# Patient Record
Sex: Female | Born: 1975 | Race: White | Hispanic: No | Marital: Single | Smoking: Never smoker
Health system: Southern US, Community
[De-identification: ages and names within clinical notes are randomized; demographics above are authoritative.]

## PROBLEM LIST (undated history)

## (undated) DIAGNOSIS — F419 Anxiety disorder, unspecified: Secondary | ICD-10-CM

## (undated) HISTORY — PX: WISDOM TOOTH EXTRACTION: SHX21

---

## 1998-01-19 HISTORY — PX: GANGLION CYST EXCISION: SHX1691

## 1998-08-05 ENCOUNTER — Other Ambulatory Visit: Admission: RE | Admit: 1998-08-05 | Discharge: 1998-08-05 | Payer: Self-pay | Admitting: Gynecology

## 1998-12-05 ENCOUNTER — Other Ambulatory Visit: Admission: RE | Admit: 1998-12-05 | Discharge: 1998-12-05 | Payer: Self-pay | Admitting: Orthopedic Surgery

## 1998-12-07 ENCOUNTER — Emergency Department (HOSPITAL_COMMUNITY): Admission: EM | Admit: 1998-12-07 | Discharge: 1998-12-08 | Payer: Self-pay | Admitting: Emergency Medicine

## 1999-01-03 ENCOUNTER — Encounter: Payer: Self-pay | Admitting: Pulmonary Disease

## 1999-01-03 ENCOUNTER — Ambulatory Visit (HOSPITAL_COMMUNITY): Admission: RE | Admit: 1999-01-03 | Discharge: 1999-01-03 | Payer: Self-pay | Admitting: Pulmonary Disease

## 1999-01-08 ENCOUNTER — Ambulatory Visit (HOSPITAL_COMMUNITY): Admission: RE | Admit: 1999-01-08 | Discharge: 1999-01-08 | Payer: Self-pay | Admitting: Pulmonary Disease

## 1999-04-20 ENCOUNTER — Emergency Department (HOSPITAL_COMMUNITY): Admission: EM | Admit: 1999-04-20 | Discharge: 1999-04-20 | Payer: Self-pay | Admitting: Emergency Medicine

## 1999-07-22 ENCOUNTER — Other Ambulatory Visit: Admission: RE | Admit: 1999-07-22 | Discharge: 1999-07-22 | Payer: Self-pay | Admitting: Gynecology

## 2000-02-12 ENCOUNTER — Encounter: Payer: Self-pay | Admitting: Emergency Medicine

## 2000-02-12 ENCOUNTER — Emergency Department (HOSPITAL_COMMUNITY): Admission: EM | Admit: 2000-02-12 | Discharge: 2000-02-12 | Payer: Self-pay | Admitting: Emergency Medicine

## 2001-01-04 ENCOUNTER — Other Ambulatory Visit: Admission: RE | Admit: 2001-01-04 | Discharge: 2001-01-04 | Payer: Self-pay | Admitting: Gynecology

## 2002-06-05 ENCOUNTER — Emergency Department (HOSPITAL_COMMUNITY): Admission: EM | Admit: 2002-06-05 | Discharge: 2002-06-05 | Payer: Self-pay | Admitting: Emergency Medicine

## 2002-10-06 ENCOUNTER — Encounter: Payer: Self-pay | Admitting: Emergency Medicine

## 2002-10-06 ENCOUNTER — Emergency Department (HOSPITAL_COMMUNITY): Admission: EM | Admit: 2002-10-06 | Discharge: 2002-10-06 | Payer: Self-pay | Admitting: Emergency Medicine

## 2003-05-24 ENCOUNTER — Other Ambulatory Visit: Admission: RE | Admit: 2003-05-24 | Discharge: 2003-05-24 | Payer: Self-pay | Admitting: Gynecology

## 2004-07-02 ENCOUNTER — Other Ambulatory Visit: Admission: RE | Admit: 2004-07-02 | Discharge: 2004-07-02 | Payer: Self-pay | Admitting: Gynecology

## 2005-07-09 ENCOUNTER — Other Ambulatory Visit: Admission: RE | Admit: 2005-07-09 | Discharge: 2005-07-09 | Payer: Self-pay | Admitting: Gynecology

## 2005-07-16 ENCOUNTER — Encounter: Admission: RE | Admit: 2005-07-16 | Discharge: 2005-07-16 | Payer: Self-pay | Admitting: Gynecology

## 2006-07-06 ENCOUNTER — Other Ambulatory Visit: Admission: RE | Admit: 2006-07-06 | Discharge: 2006-07-06 | Payer: Self-pay | Admitting: Gynecology

## 2010-08-29 ENCOUNTER — Other Ambulatory Visit (HOSPITAL_BASED_OUTPATIENT_CLINIC_OR_DEPARTMENT_OTHER): Payer: Self-pay

## 2010-08-29 ENCOUNTER — Emergency Department (HOSPITAL_BASED_OUTPATIENT_CLINIC_OR_DEPARTMENT_OTHER)
Admission: EM | Admit: 2010-08-29 | Discharge: 2010-08-29 | Disposition: A | Discharge status: Home / Self Care | Payer: Self-pay | Attending: Emergency Medicine | Admitting: Emergency Medicine

## 2010-08-29 ENCOUNTER — Other Ambulatory Visit: Payer: Self-pay

## 2010-08-29 ENCOUNTER — Encounter: Payer: Self-pay | Admitting: *Deleted

## 2010-08-29 DIAGNOSIS — R0602 Shortness of breath: Secondary | ICD-10-CM | POA: Insufficient documentation

## 2010-08-29 DIAGNOSIS — F17200 Nicotine dependence, unspecified, uncomplicated: Secondary | ICD-10-CM | POA: Insufficient documentation

## 2010-08-29 DIAGNOSIS — R079 Chest pain, unspecified: Secondary | ICD-10-CM | POA: Insufficient documentation

## 2010-08-29 HISTORY — DX: Anxiety disorder, unspecified: F41.9

## 2010-08-29 LAB — COMPREHENSIVE METABOLIC PANEL
AST: 15 U/L (ref 0–37)
Albumin: 3.8 g/dL (ref 3.5–5.2)
Chloride: 105 mEq/L (ref 96–112)
Creatinine, Ser: 0.5 mg/dL (ref 0.50–1.10)
Potassium: 3.6 mEq/L (ref 3.5–5.1)
Total Bilirubin: 0.8 mg/dL (ref 0.3–1.2)
Total Protein: 6.5 g/dL (ref 6.0–8.3)

## 2010-08-29 LAB — CBC
MCHC: 35.1 g/dL (ref 30.0–36.0)
Platelets: 187 10*3/uL (ref 150–400)
RDW: 12 % (ref 11.5–15.5)
WBC: 8.7 10*3/uL (ref 4.0–10.5)

## 2010-08-29 LAB — CARDIAC PANEL(CRET KIN+CKTOT+MB+TROPI)
CK, MB: 1.4 ng/mL (ref 0.3–4.0)
Relative Index: INVALID (ref 0.0–2.5)

## 2010-08-29 LAB — PREGNANCY, URINE: Preg Test, Ur: NEGATIVE

## 2010-08-29 LAB — D-DIMER, QUANTITATIVE: D-Dimer, Quant: <0.22 ug/mL-FEU (ref 0.00–0.48)

## 2010-08-29 NOTE — ED Notes (Signed)
Pt brought in by EMS , pt c/o CP x 3 days, Given asa 324mg  and nitro sl x 1 by ems PTA without relief. HX anxiety.

## 2010-08-30 NOTE — ED Provider Notes (Signed)
History     CSN: [id number] Arrival date & time: 08/29/2010  7:29 PM  Chief Complaint  Patient presents with  . Chest Pain   Patient is a 35 y.o. female presenting with chest pain. The history is provided by the patient. No language interpreter was used.  Chest Pain The chest pain began 3 - 5 hours ago. Duration of episode(s) is 30 minutes. Chest pain occurs constantly. The chest pain is resolved. The pain is associated with breathing, coughing, stress and exertion. At its most intense, the pain is at 10/10. The pain is currently at 0/10. The quality of the pain is described as aching and tightness. The pain does not radiate. Chest pain is worsened by deep breathing, exertion and stress. Primary symptoms include shortness of breath. Pertinent negatives for primary symptoms include no fever and no cough. She tried nothing for the symptoms. Risk factors include oral contraceptive use, smoking/tobacco exposure and stress.  Her past medical history is significant for anxiety/panic attacks.  Her family medical history is significant for CAD in family.    patient had one episode of chest pain today and said she was uncertain if this is anxiety or not. She was concerned based on how strong the pain was. Patient did describe some variation with respiration with this. There are no other associated modifying factors.  Past Medical History  Diagnosis Date  . Anxiety     History reviewed. No pertinent past surgical history.  History reviewed. No pertinent family history.  History  Substance Use Topics  . Smoking status: Never Smoker   . Smokeless tobacco: Not on file  . Alcohol Use: No    OB History    Grav Para Term Preterm Abortions TAB SAB Ect Mult Living                  Review of Systems  Constitutional: Negative.  Negative for fever.  HENT: Negative.   Eyes: Negative.   Respiratory: Positive for shortness of breath. Negative for cough.   Cardiovascular: Positive for chest pain.    Gastrointestinal: Negative.   Genitourinary: Negative.   Musculoskeletal: Negative.   Skin: Negative.   Neurological: Negative.   Hematological: Negative.   Psychiatric/Behavioral: Negative.   All other systems reviewed and are negative.    Physical Exam  BP 106/61  Pulse 77  Temp(Src) 98.3 F (36.8 C) (Oral)  Resp 16  SpO2 100%  LMP 06/29/2010  Physical Exam  Nursing note and vitals reviewed. Constitutional: She is oriented to person, place, and time. She appears well-developed and well-nourished. No distress.  HENT:  Head: Normocephalic and atraumatic.  Eyes: Conjunctivae and EOM are normal. Pupils are equal, round, and reactive to light.  Neck: Normal range of motion.  Cardiovascular: Normal rate, regular rhythm, normal heart sounds and intact distal pulses.  Exam reveals no gallop and no friction rub.   No murmur heard. Pulmonary/Chest: Effort normal and breath sounds normal. No respiratory distress. She has no wheezes. She has no rales. She exhibits no tenderness.  Abdominal: Soft. Bowel sounds are normal. She exhibits no distension. There is no tenderness. There is no rebound and no guarding.  Musculoskeletal: Normal range of motion. She exhibits no edema and no tenderness.  Neurological: She is alert and oriented to person, place, and time. No cranial nerve deficit. She exhibits normal muscle tone. Coordination normal.  Skin: Skin is warm and dry. No rash noted. No erythema.  Psychiatric: She has a normal mood and affect. Her  behavior is normal.    ED Course  Procedures  Date: 08/30/2010  Rate: 77   Rhythm: normal sinus rhythm  QRS Axis: normal  Intervals: normal  ST/T Wave abnormalities: normal  Conduction Disutrbances:none  Narrative Interpretation:   Old EKG Reviewed: unchanged  MDM Patient was examined by myself and said that her chest pain had resolved at this time. Patient was uncertain if she could be pregnant today and urine pregnancy was ordered and  returned negative. Patient does smoke and is on oral contraceptives. She did have a negative d-dimer. CBC, renal panel, EKG, and troponin were also negative. Patient remained hemodynamically stable and had no pain at the time of discharge. She was discharged home in good condition and told she was welcome to return further emergent concerns.  Assessment: 62 row female who presents today with one episode of chest pain.  Plan: Discharge home in good condition as per MDM above. Patient was shared results of all testing today. She stated understanding assaulters will return if she had any other emergent concerns.      Cyndra Numbers, MD 09/05/10 [phone]

## 2013-01-19 ENCOUNTER — Ambulatory Visit: Payer: 59 | Admitting: Physician Assistant

## 2013-01-19 VITALS — BP 118/72 | HR 91 | Temp 98.0°F | Resp 16 | Ht 59.0 in | Wt 130.2 lb

## 2013-01-19 DIAGNOSIS — J029 Acute pharyngitis, unspecified: Secondary | ICD-10-CM

## 2013-01-19 DIAGNOSIS — R059 Cough, unspecified: Secondary | ICD-10-CM

## 2013-01-19 DIAGNOSIS — R05 Cough: Secondary | ICD-10-CM

## 2013-01-19 MED ORDER — GUAIFENESIN ER 1200 MG PO TB12: 1.0000 | ORAL_TABLET | Freq: Two times a day (BID) | ORAL | Status: DC | PRN

## 2013-01-19 MED ORDER — IPRATROPIUM BROMIDE 0.03 % NA SOLN: 2.0000 | Freq: Two times a day (BID) | NASAL | Status: DC

## 2013-01-19 MED ORDER — HYDROCOD POLST-CHLORPHEN POLST 10-8 MG/5ML PO LQCR: 5.0000 mL | Freq: Two times a day (BID) | ORAL | Status: DC | PRN

## 2013-01-19 NOTE — Progress Notes (Signed)
Subjective:    Patient ID: Heather Bolton, female    DOB: 06/02/1975, 38 y.o.   MRN: [id number]  PCP: No primary provider on file.  Chief Complaint  Patient presents with  . Sore Throat    x 3 weeks   . Cough    x 3 weeks      Active Ambulatory Problems    Diagnosis Date Noted  . No Active Ambulatory Problems   Resolved Ambulatory Problems    Diagnosis Date Noted  . No Resolved Ambulatory Problems   Past Medical History  Diagnosis Date  . Anxiety     Past Surgical History  Procedure Laterality Date  . Ganglion cyst excision Left 2000    No Known Allergies  Prior to Admission medications   Medication Sig Start Date End Date Taking? Authorizing Provider  Multiple Vitamin (MULTIVITAMIN) tablet Take 1 tablet by mouth daily.     Yes Historical Provider, MD  norethindrone-ethinyl estradiol 1/35 (ORTHO-NOVUM, NORTREL,CYCLAFEM) tablet Take 1 tablet by mouth daily.   Yes Historical Provider, MD    History   Social History  . Marital Status: Single    Spouse Name: n/a    Number of Children: 0  . Years of Education: 12th grade   Occupational History  . [employer]    Social History Main Topics  . Smoking status: Never Smoker   . Smokeless tobacco: Never Used  . Alcohol Use: No  . Drug Use: No  . Sexual Activity: No   Other Topics Concern  . None   Social History Narrative   Her mother lives with her.    family history includes Cancer (age of onset: 263) in her maternal grandmother; Cancer (age of onset: 2666) in her paternal grandfather; Diabetes in her father; Hyperlipidemia in her mother; Stroke in her paternal grandfather and paternal grandmother; Stroke (age of onset: 1552) in her mother. indicated that her mother is alive. She indicated that her father is alive. She indicated that her brother is alive. She indicated that her maternal grandmother is deceased. She indicated that her maternal grandfather is deceased. She indicated that her paternal grandmother is  deceased. She indicated that her paternal grandfather is deceased.   HPI  3 weeks ago, developed a sore throat.  She was seen at a [facility] and treated for sinusitis with Augmentin.  She notes that she gets sinus infections 2-3 times annually, but that she got no benefit from the treatment. The sore throat, mostly on the RIGHT, ear congestion and nasal congestion, also worse on the RIGHT.  1 week later, 2 weeks ago, with persistent symptoms, she went to another clinic and was told she didn't need another antibiotic.  She was given a steroid taper (60 mg to 10 mg), Flonase and Norel AD (contains chlorpheniramine, APAP and phenelephrine).  The nasal stuffiness and ear pressure are almost resolved, but the ST and cough persists.  She's "pulled a muscle" in the back with all the coughing.  Tried leftover [employer] Perles and "every home remedy on the internet" without any benefit.  She's had increased indigestion and gas, increased heartburn recently, but no nausea/vomiting or diarrhea.  No fever/chills. No unexplained myalgias/arthralgias or rash.  Review of Systems As above.    Objective:   Physical Exam Blood pressure 118/72, pulse 91, temperature 98 F (36.7 C), temperature source Oral, resp. rate 16, height 4\' 11"  (1.499 m), weight 130 lb 3.2 oz (59.058 kg), last menstrual period 01/02/2013, SpO2 98.00%. Body mass  index is 26.28 kg/(m^2). Well-developed, well nourished WF who is awake, alert and oriented, in NAD. HEENT: [place]/AT, PERRL, EOMI.  Sclera and conjunctiva are clear.  EAC are patent, TMs are normal in appearance. Nasal mucosa is minimally congested, pink and moist. OP is clear. No erythema, exudate or edema. Neck: supple, non-tender, no lymphadenopathy, thyromegaly. Heart: RRR, no murmur Lungs: normal effort, CTA Extremities: no cyanosis, clubbing or edema. Skin: warm and dry without rash. Psychologic: good mood and appropriate affect, normal speech and behavior.           Assessment & Plan:  1. Acute pharyngitis 2. Cough Likely post-nasal drainage from persistent rhinitis. - Guaifenesin (MUCINEX MAXIMUM STRENGTH) 1200 MG TB12; Take 1 tablet (1,200 mg total) by mouth every 12 (twelve) hours as needed.  Dispense: 14 tablet; Refill: 1 - ipratropium (ATROVENT) 0.03 % nasal spray; Place 2 sprays into both nostrils 2 (two) times daily.  Dispense: 30 mL; Refill: 0 - chlorpheniramine-HYDROcodone (TUSSIONEX PENNKINETIC ER) 10-8 MG/5ML LQCR; Take 5 mLs by mouth every 12 (twelve) hours as needed for cough (cough).  Dispense: 100 mL; Refill: 0  Patient Instructions  Get plenty of rest and drink at least 64 ounces of water daily. Continue the Flonase. Either continue the Norel AD or switch to an antihistamine (like Claritin, Zyrtec or Allegra). Take Ibuprofen 600 mg three times daily, with food, or Acetaminophen 325 mg every 4 hours (do not take the acetaminophen AND Norel together).   Supportive care.  Anticipatory guidance.  RTC if symptoms worsen/persist.   Fernande Bras, PA-C Physician Assistant-Certified [facility]

## 2013-01-19 NOTE — Patient Instructions (Addendum)
Get plenty of rest and drink at least 64 ounces of water daily. Continue the Flonase. Either continue the Norel AD or switch to an antihistamine (like Claritin, Zyrtec or Allegra). Take Ibuprofen 600 mg three times daily, with food, or Acetaminophen 325 mg every 4 hours (do not take the acetaminophen AND Norel together).

## 2017-04-20 ENCOUNTER — Other Ambulatory Visit: Payer: Self-pay | Admitting: Obstetrics and Gynecology

## 2017-04-20 ENCOUNTER — Ambulatory Visit
Admission: RE | Admit: 2017-04-20 | Discharge: 2017-04-20 | Disposition: A | Discharge status: Home / Self Care | Payer: BLUE CROSS/BLUE SHIELD | Source: Ambulatory Visit | Attending: Obstetrics and Gynecology | Admitting: Obstetrics and Gynecology

## 2017-04-20 DIAGNOSIS — R928 Other abnormal and inconclusive findings on diagnostic imaging of breast: Secondary | ICD-10-CM

## 2017-04-20 DIAGNOSIS — N63 Unspecified lump in unspecified breast: Secondary | ICD-10-CM

## 2017-10-22 ENCOUNTER — Ambulatory Visit
Admission: RE | Admit: 2017-10-22 | Discharge: 2017-10-22 | Disposition: A | Discharge status: Home / Self Care | Payer: BLUE CROSS/BLUE SHIELD | Source: Ambulatory Visit | Attending: Obstetrics and Gynecology | Admitting: Obstetrics and Gynecology

## 2017-10-22 ENCOUNTER — Other Ambulatory Visit: Payer: Self-pay | Admitting: Obstetrics and Gynecology

## 2017-10-22 DIAGNOSIS — N6001 Solitary cyst of right breast: Secondary | ICD-10-CM

## 2017-10-22 DIAGNOSIS — N63 Unspecified lump in unspecified breast: Secondary | ICD-10-CM

## 2018-04-29 ENCOUNTER — Other Ambulatory Visit: Payer: Self-pay

## 2018-04-29 ENCOUNTER — Ambulatory Visit
Admission: RE | Admit: 2018-04-29 | Discharge: 2018-04-29 | Disposition: A | Discharge status: Home / Self Care | Payer: BLUE CROSS/BLUE SHIELD | Source: Ambulatory Visit | Attending: Obstetrics and Gynecology | Admitting: Obstetrics and Gynecology

## 2018-04-29 ENCOUNTER — Other Ambulatory Visit: Payer: BLUE CROSS/BLUE SHIELD

## 2018-04-29 DIAGNOSIS — N6001 Solitary cyst of right breast: Secondary | ICD-10-CM

## 2019-09-23 IMAGING — MG DIGITAL DIAGNOSTIC BILATERAL MAMMOGRAM WITH TOMO AND CAD
8 series · 8 of 24 positions shown · non-contrast
Comparison: Previous exam(s).

CLINICAL DATA: 43-year-old female presenting for annual bilateral
mammogram and follow-up of probably benign right breast masses.

EXAM:
DIGITAL DIAGNOSTIC BILATERAL MAMMOGRAM WITH CAD AND TOMO
ULTRASOUND RIGHT BREAST

[L CC synth-2D]
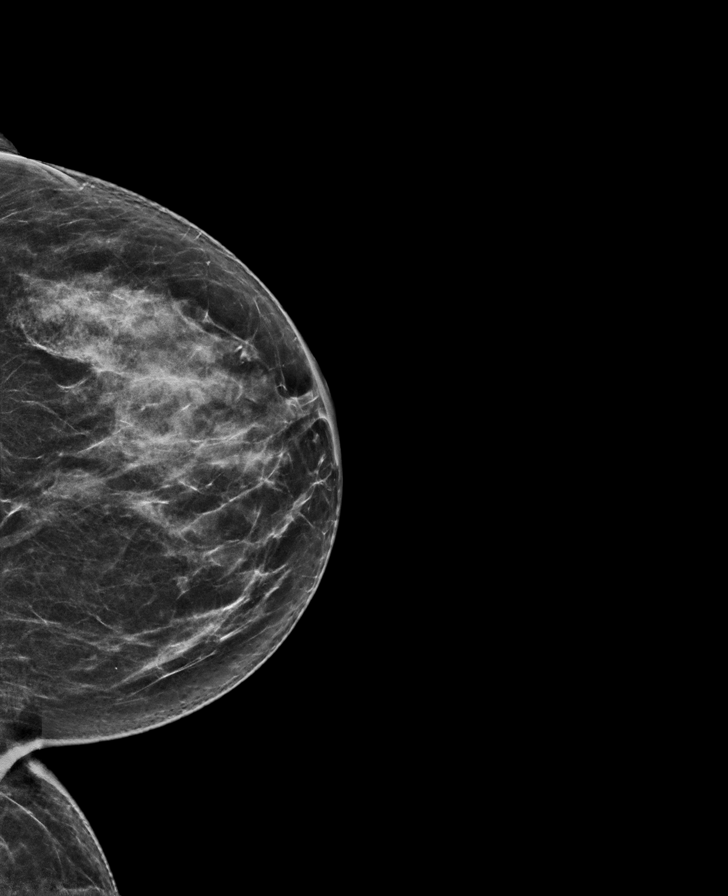

[R MLO synth-2D]
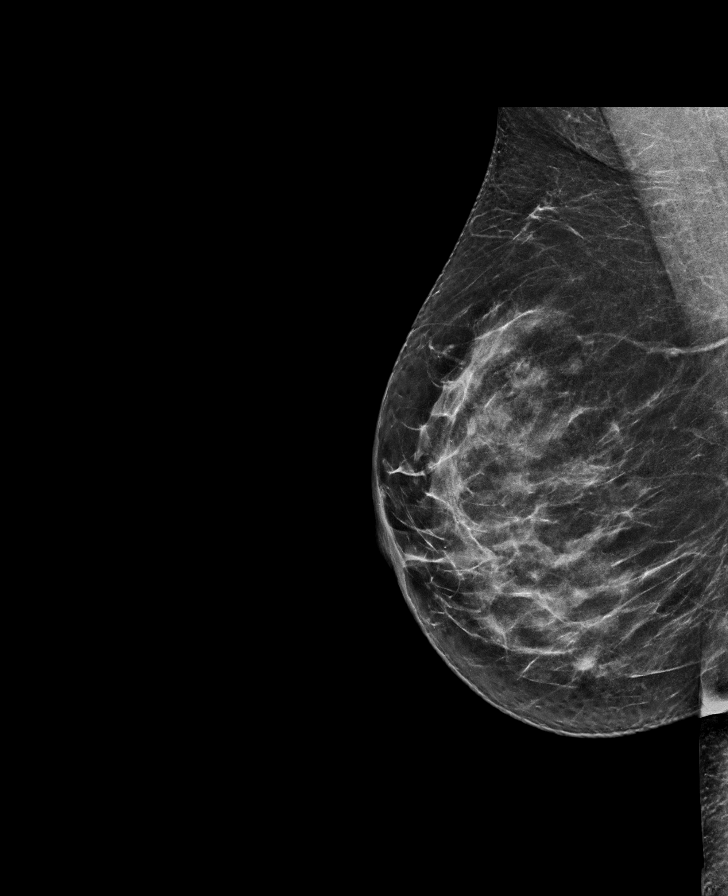

[L MLO synth-2D]
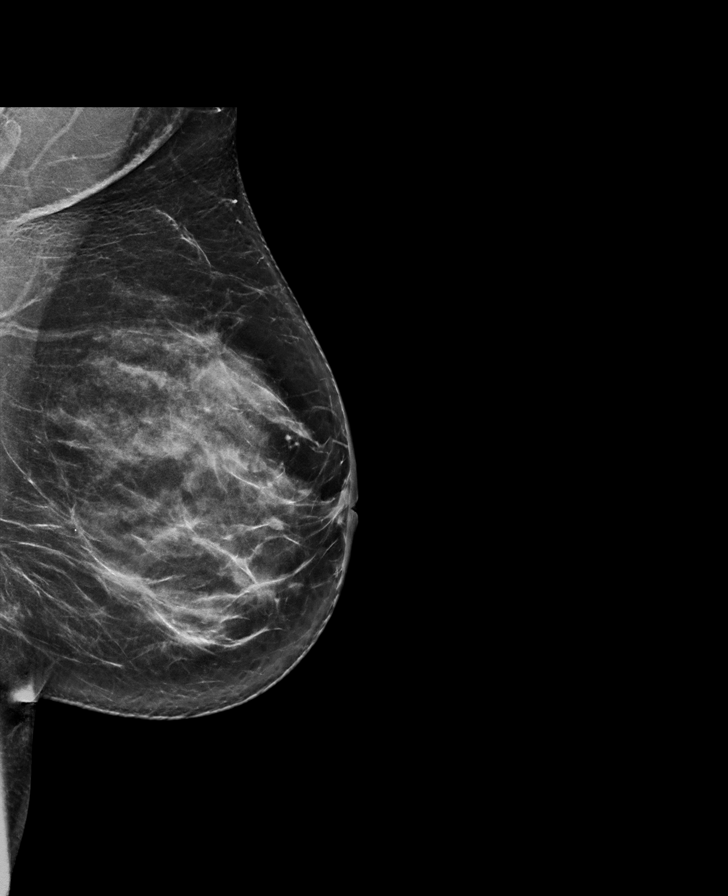

[R CC synth-2D]
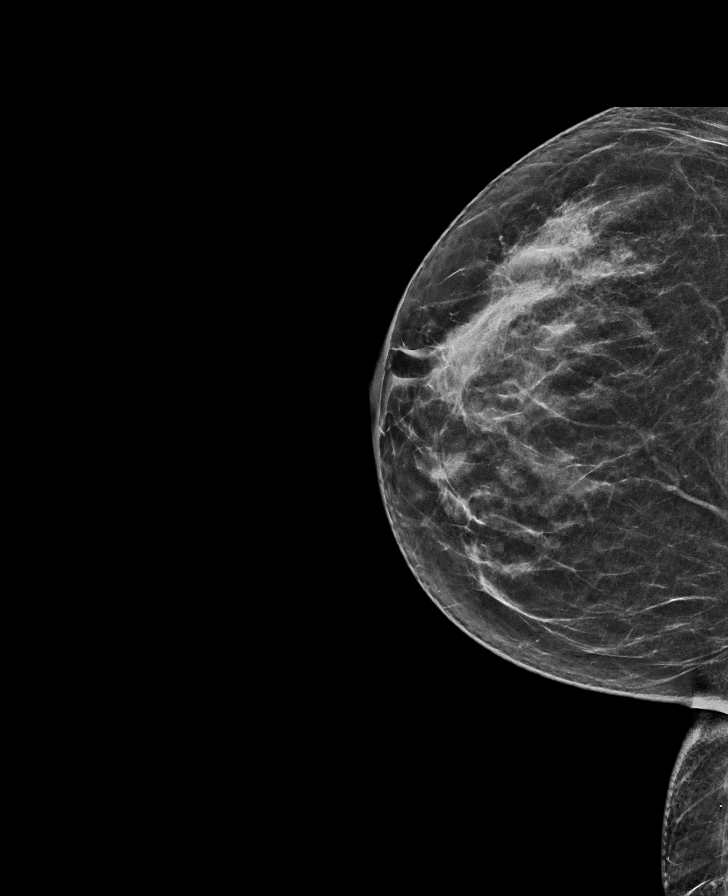

[R MLO tomo · tomo slice 35/70.0]
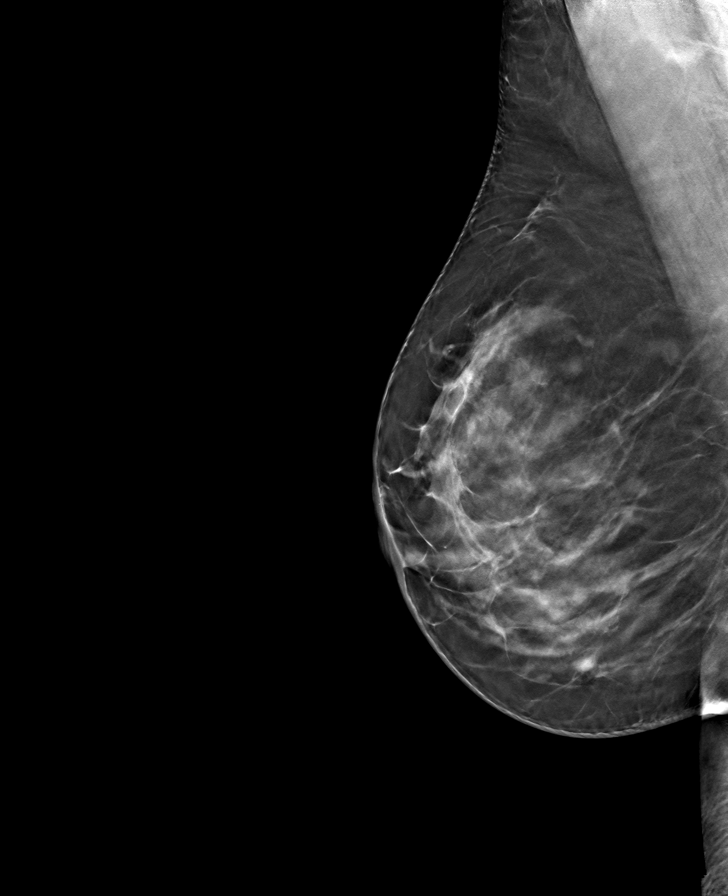

[L MLO tomo · tomo slice 39/77.0]
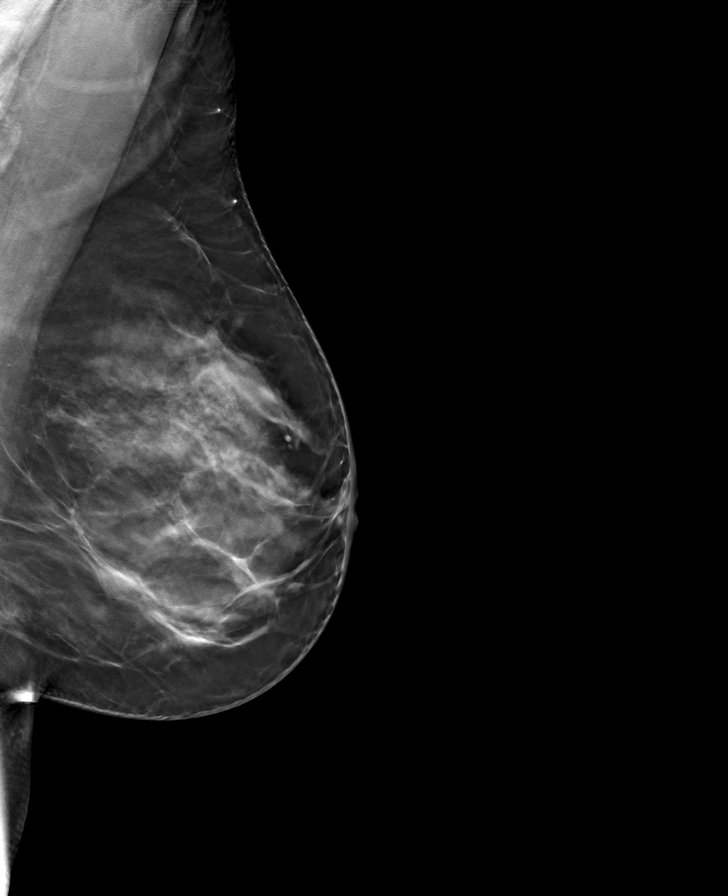

[R CC tomo · tomo slice 33/64.0]
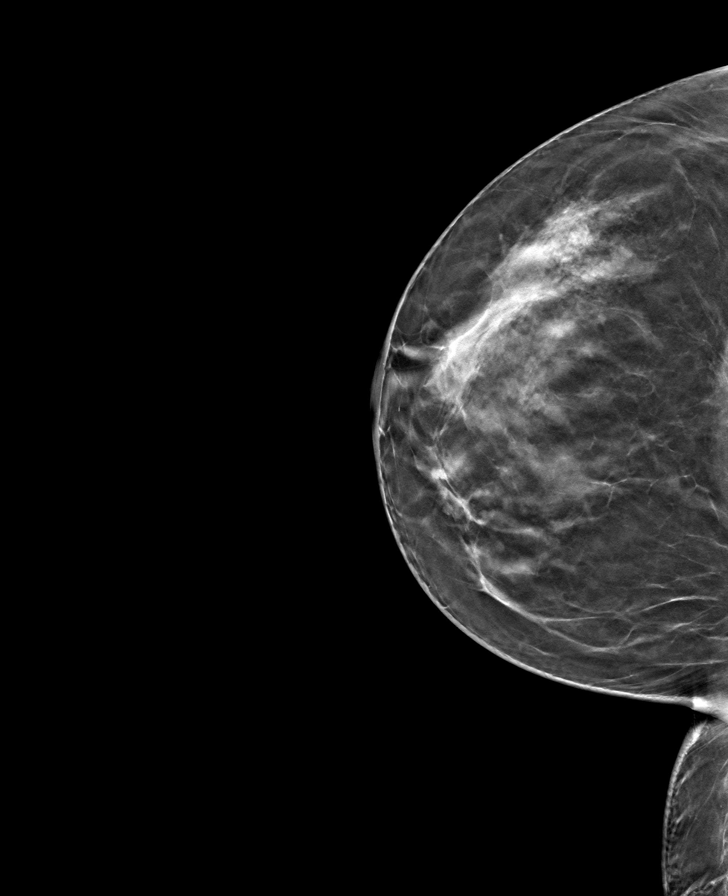

[L CC tomo · tomo slice 35/69.0]
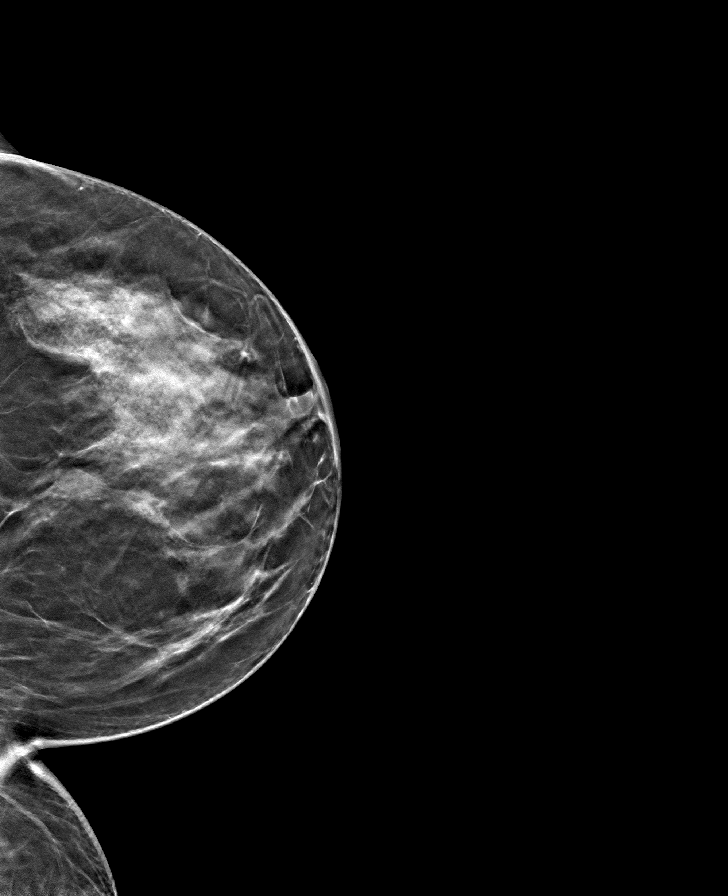

[8 of 24 positions shown; findings below may reference images not displayed]

ACR Breast Density Category c: The breast tissue is heterogeneously
dense, which may obscure small masses.
FINDINGS: No suspicious mammographic findings are identified in either breast.
The parenchymal pattern is stable.

Mammographic images were processed with CAD.

Targeted ultrasound is performed, showing stable appearance of multi
cystic masses within the right breast. This includes a mass at the
10 o'clock position 3 cm from the nipple measuring 9 x 9 x 5 mm
(previously 9 x 9 x 5 mm) and a mass at the 10 o'clock position 4 cm
from the nipple measuring 1.2 x 1.0 x 0.6 cm (previously 1.4 x 1.3 x
0.7 cm).
IMPRESSION: 1. Stable, probably benign right breast masses. Recommend a final
ultrasound follow-up in 1 year to coincide with the patient's annual
bilateral mammogram.
2. No mammographic evidence of malignancy on the left.

RECOMMENDATION:
Bilateral diagnostic mammogram and right breast ultrasound in 1
year.

I have discussed the findings and recommendations with the patient.
Results were also provided in writing at the conclusion of the
visit. If applicable, a reminder letter will be sent to the patient
regarding the next appointment.

BI-RADS CATEGORY  3: Probably benign.

## 2020-07-25 ENCOUNTER — Ambulatory Visit
Admission: RE | Admit: 2020-07-25 | Discharge: 2020-07-25 | Disposition: A | Discharge status: Home / Self Care | Payer: Commercial Managed Care - PPO | Source: Ambulatory Visit | Attending: Emergency Medicine | Admitting: Emergency Medicine

## 2020-07-25 ENCOUNTER — Other Ambulatory Visit: Payer: Self-pay

## 2020-07-25 VITALS — BP 151/80 | HR 88 | Temp 98.4°F

## 2020-07-25 DIAGNOSIS — J069 Acute upper respiratory infection, unspecified: Secondary | ICD-10-CM

## 2020-07-25 MED ORDER — AMOXICILLIN-POT CLAVULANATE 875-125 MG PO TABS: 1.0000 | ORAL_TABLET | Freq: Two times a day (BID) | ORAL | 0 refills | Status: AC

## 2020-07-25 MED ORDER — BENZONATATE 100 MG PO CAPS: 100.0000 mg | ORAL_CAPSULE | Freq: Three times a day (TID) | ORAL | 0 refills | Status: DC

## 2020-07-25 NOTE — ED Provider Notes (Signed)
[facility]   [id number] 07/25/20 Arrival Time: 1148   CC: COVID symptoms  SUBJECTIVE: History from: patient.  Heather Bolton is a 45 y.o. female who presents with sinus congestion, cough, fatigued, vomiting, sore throat, diarrhea x 6 days  Denies sick exposure to COVID, flu or strep.  Denies alleviating or aggravating factors.  Reports hx of covid infection.  Denies fever, chills, SOB, wheezing, chest pain, nausea, changes in bowel or bladder habits.    ROS: As per HPI.  All other pertinent ROS negative.     Past Medical History:  Diagnosis Date   Anxiety    Past Surgical History:  Procedure Laterality Date   GANGLION CYST EXCISION Left 2000   No Known Allergies No current facility-administered medications on file prior to encounter.   Current Outpatient Medications on File Prior to Encounter  Medication Sig Dispense Refill   chlorpheniramine-HYDROcodone (TUSSIONEX PENNKINETIC ER) 10-8 MG/5ML LQCR Take 5 mLs by mouth every 12 (twelve) hours as needed for cough (cough). 100 mL 0   Guaifenesin (MUCINEX MAXIMUM STRENGTH) 1200 MG TB12 Take 1 tablet (1,200 mg total) by mouth every 12 (twelve) hours as needed. 14 tablet 1   ipratropium (ATROVENT) 0.03 % nasal spray Place 2 sprays into both nostrils 2 (two) times daily. 30 mL 0   Multiple Vitamin (MULTIVITAMIN) tablet Take 1 tablet by mouth daily.       norethindrone-ethinyl estradiol 1/35 (ORTHO-NOVUM, NORTREL,CYCLAFEM) tablet Take 1 tablet by mouth daily.     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   Social Determinants of Health   Financial Resource Strain: Not on file  Food Insecurity: Not on file  Transportation Needs: Not on file  Physical Activity: Not on file  Stress: Not on file  Social Connections: Not on file  Intimate Partner Violence: Not on file   Family History  Problem Relation Age of Onset   Hyperlipidemia Mother    Stroke Mother 38   Diabetes Father    Cancer Maternal Grandmother 35       colon   Stroke Paternal Grandmother    Cancer Paternal Grandfather 100       lung; remote tobacco history   Stroke Paternal Grandfather     OBJECTIVE:  Vitals:   07/25/20 1208  BP: (!) 151/80  Pulse: 88  Temp: 98.4 F (36.9 C)  TempSrc: Oral  SpO2: 98%     General appearance: alert; appears fatigued, but nontoxic; speaking in full sentences and tolerating own secretions HEENT: NCAT; Ears: EACs clear, TMs pearly gray; Eyes: PERRL.  EOM grossly intact. Nose: nares patent without rhinorrhea, Throat: oropharynx clear, tonsils non erythematous or enlarged, uvula midline  Neck: supple without LAD Lungs: unlabored respirations, symmetrical air entry; cough: mild; no respiratory distress; CTAB Heart: regular rate and rhythm.   Skin: warm and dry Psychological: alert and cooperative; normal mood and affect  ASSESSMENT & PLAN:  1. Viral URI  with cough     Meds ordered this encounter  Medications   amoxicillin-clavulanate (AUGMENTIN) 875-125 MG tablet    Sig: Take 1 tablet by mouth every 12 (twelve) hours for 10 days.    Dispense:  20 tablet    Refill:  0    Order Specific Question:   Supervising Provider    Answer:   Eustace Moore [[id number]]   benzonatate (TESSALON) 100 MG capsule    Sig: Take 1 capsule (100 mg total) by mouth every 8 (eight) hours.    Dispense:  21 capsule    Refill:  0    Order Specific Question:   Supervising Provider    Answer:   Eustace Moore [[id number]]    COVID testing ordered.   It will take between 5-7 days for test results.  Someone will contact you regarding abnormal results.    In the meantime: You should remain isolated in your home for 5 days from symptom onset AND greater than 72 hours after symptoms resolution (absence of fever without the use of fever-reducing medication and improvement in respiratory symptoms), whichever is longer Get plenty of rest and push fluids Tessalon Perles prescribed for cough Augmentin for sinus infection Use OTC zyrtec for nasal congestion, runny nose, and/or sore throat Use OTC flonase for nasal congestion and runny nose Use medications daily for symptom relief Use OTC medications like ibuprofen or tylenol as needed fever or pain Call or go to the ED if you have any new or worsening symptoms such as fever, worsening cough, shortness of breath, chest tightness, chest pain, turning blue, changes in mental status, etc...   Reviewed expectations re: course of current medical issues. Questions answered. Outlined signs and symptoms indicating need for more acute intervention. Patient verbalized understanding. After Visit Summary given.          Rennis Harding, PA-C 07/25/20 1215

## 2020-07-25 NOTE — ED Triage Notes (Signed)
Cough , congestion and sinus pressure x 6 days

## 2020-07-25 NOTE — Discharge Instructions (Addendum)
COVID testing ordered.  It will take between 5-7 days for test results.  Someone will contact you regarding abnormal results.    In the meantime: You should remain isolated in your home for 5 days from symptom onset AND greater than 72 hours after symptoms resolution (absence of fever without the use of fever-reducing medication and improvement in respiratory symptoms), whichever is longer Get plenty of rest and push fluids Tessalon Perles prescribed for cough Augmentin for sinus infection Use OTC zyrtec for nasal congestion, runny nose, and/or sore throat Use OTC flonase for nasal congestion and runny nose Use medications daily for symptom relief Use OTC medications like ibuprofen or tylenol as needed fever or pain Call or go to the ED if you have any new or worsening symptoms such as fever, worsening cough, shortness of breath, chest tightness, chest pain, turning blue, changes in mental status, etc...   DO NOT TAKE ABOVE MEDICATIONS IF PREGNANT

## 2020-07-26 LAB — COVID-19, FLU A+B NAA
Influenza A, NAA: NOT DETECTED
Influenza B, NAA: NOT DETECTED
SARS-CoV-2, NAA: NOT DETECTED

## 2023-01-05 ENCOUNTER — Other Ambulatory Visit (HOSPITAL_COMMUNITY): Payer: Self-pay | Admitting: Adult Health Nurse Practitioner

## 2023-01-05 DIAGNOSIS — E049 Nontoxic goiter, unspecified: Secondary | ICD-10-CM

## 2023-01-05 DIAGNOSIS — Z1231 Encounter for screening mammogram for malignant neoplasm of breast: Secondary | ICD-10-CM

## 2023-01-06 ENCOUNTER — Other Ambulatory Visit (HOSPITAL_COMMUNITY): Payer: Self-pay | Admitting: Adult Health Nurse Practitioner

## 2023-01-06 DIAGNOSIS — N63 Unspecified lump in unspecified breast: Secondary | ICD-10-CM

## 2023-02-04 ENCOUNTER — Encounter: Payer: Self-pay | Admitting: Gastroenterology

## 2023-03-02 ENCOUNTER — Encounter (HOSPITAL_COMMUNITY): Payer: Self-pay

## 2023-03-02 ENCOUNTER — Ambulatory Visit (HOSPITAL_COMMUNITY)
Admission: RE | Admit: 2023-03-02 | Discharge: 2023-03-02 | Disposition: A | Discharge status: Home / Self Care | Payer: Commercial Managed Care - PPO | Source: Ambulatory Visit | Attending: Adult Health Nurse Practitioner

## 2023-03-02 ENCOUNTER — Ambulatory Visit: Payer: Commercial Managed Care - PPO | Admitting: Gastroenterology

## 2023-03-02 ENCOUNTER — Encounter: Payer: Self-pay | Admitting: Gastroenterology

## 2023-03-02 ENCOUNTER — Ambulatory Visit (HOSPITAL_COMMUNITY)
Admission: RE | Admit: 2023-03-02 | Discharge: 2023-03-02 | Disposition: A | Discharge status: Home / Self Care | Payer: Commercial Managed Care - PPO | Source: Ambulatory Visit | Attending: Adult Health Nurse Practitioner | Admitting: Adult Health Nurse Practitioner

## 2023-03-02 VITALS — BP 130/79 | HR 92 | Temp 98.4°F | Ht 59.0 in | Wt 118.2 lb

## 2023-03-02 DIAGNOSIS — K580 Irritable bowel syndrome with diarrhea: Secondary | ICD-10-CM

## 2023-03-02 DIAGNOSIS — K529 Noninfective gastroenteritis and colitis, unspecified: Secondary | ICD-10-CM | POA: Insufficient documentation

## 2023-03-02 DIAGNOSIS — N63 Unspecified lump in unspecified breast: Secondary | ICD-10-CM

## 2023-03-02 DIAGNOSIS — Z800 Family history of malignant neoplasm of digestive organs: Secondary | ICD-10-CM | POA: Diagnosis not present

## 2023-03-02 DIAGNOSIS — E049 Nontoxic goiter, unspecified: Secondary | ICD-10-CM | POA: Diagnosis present

## 2023-03-02 DIAGNOSIS — Z83719 Family history of colon polyps, unspecified: Secondary | ICD-10-CM | POA: Diagnosis not present

## 2023-03-02 NOTE — Patient Instructions (Signed)
Complete labs at [facility]. We will be in touch with results. After results reviewed, we will move forward with colonoscopy.   Try using anti-diarrheal medication 1/2 to 1 tablet daily as needed to control stools.  I will include diet for irritable bowel syndrome (IBS), this may help you control your symptoms, although at this time it is not clear if you have IBS.

## 2023-03-02 NOTE — Progress Notes (Signed)
 GI Office Note    Referring Provider: Roe Rutherford, NP Primary Care Physician:  Roe Rutherford, NP  Primary Gastroenterologist: Roetta Sessions, MD    Chief Complaint   Chief Complaint  Patient presents with   New Patient (Initial Visit)    Pt here IBS-D     History of Present Illness   Heather Bolton is a 48 y.o. female presenting today at the request of Roe Rutherford, NP for further evaluation of "IBS with diarrhea, FH of colon cancer".   Labs 12/2022: White blood cell count 9800, hemoglobin 14.5, platelets 395,000, TSH 2.840, thyroid peroxidase ab of 50, glucose 65, creatinine 0.73, albumin 4.2, total bilirubin 0.5, alkaline phosphatase 74, AST 15, ALT 15.  Today: patient states she had recent abnormal thyroid labs and completed ultrasound today. She states she has had a change in her bowel habits about a year ago. She typically normal BMs but now having issues with diarrhea. Postprandial fecal urgency, abdominal cramps, gassiness. Some normal stools but mostly soft serve or watery stools. BM 5-6 per day. Sometimes blood in stool, but she believes it to be secondary to hemorrhoids. States she has had hemorrhoid banding by Dr. Romie Levee in 2022, only completed 2 of 3 sessions.   She is a long distance Naval architect.  Partners with her husband who also drives.  She states that she does not eat very well when she is on the road 4 to 5 days at a time.  She is not sure if her change in bowel habits related to something she is eating.  Denies any melena.  Rare heartburn, typically with pizza.  No dysphagia.  No vomiting.  No unintentional weight loss.  Taking over-the-counter 8 antidiarrheal such as loperamide if she has more than 3 stools in a day.  [name] just takes 1 loperamide, occasionally has to take 2.  Typically will go a day or 2 without a bowel movement afterwards.    No prior colonoscopy.  Mother has had a history of colon polyps.  Maternal grandmother had  colon cancer in her early 17s.    Medications   Current Outpatient Medications  Medication Sig Dispense Refill   Multiple Vitamin (MULTIVITAMIN) tablet Take 1 tablet by mouth daily.       norgestrel-ethinyl estradiol (LO/OVRAL) 0.3-30 MG-MCG tablet Take 1 tablet by mouth daily.     No current facility-administered medications for this visit.    Allergies   Allergies as of 03/02/2023   (No Known Allergies)    Past Medical History   Past Medical History:  Diagnosis Date   Anxiety     Past Surgical History   Past Surgical History:  Procedure Laterality Date   GANGLION CYST EXCISION Left 2000    Past Family History   Family History  Problem Relation Age of Onset   Hyperlipidemia Mother    Stroke Mother 48   Colon polyps Mother    Diabetes Father    Cancer Maternal Grandmother 4       colon   Stroke Paternal Grandmother    Cancer Paternal Grandfather 23       lung; remote tobacco history   Stroke Paternal Grandfather     Past Social History   Social History   Socioeconomic History   Marital status: Single    Spouse name: n/a   Number of children: 0   Years of education: 12th grade   Highest education level: Not on file  Occupational History  Occupation: Electronics engineer: [employer]  Tobacco Use   Smoking status: Never   Smokeless tobacco: Never  Substance and Sexual Activity   Alcohol use: No   Drug use: No   Sexual activity: Never  Other Topics Concern   Not on file  Social History Narrative   Her mother lives with her.   Social Drivers of Corporate investment banker Strain: Low Risk  (01/11/2023)   Received from [facility]   Overall Financial Resource Strain (CARDIA)    Difficulty of Paying Living Expenses: Not very hard  Food Insecurity: No Food Insecurity (01/11/2023)   Received from [facility]   Hunger Vital Sign    Worried About Running Out of Food in the Last Year: Never true    Ran Out of Food in the Last Year:  Never true  Transportation Needs: No Transportation Needs (01/11/2023)   Received from [facility] - Transportation    Lack of Transportation (Medical): No    Lack of Transportation (Non-Medical): No  Physical Activity: Unknown (01/11/2023)   Received from [facility]   Exercise Vital Sign    Days of Exercise per Week: Patient declined    Minutes of Exercise per Session: Not on file  Stress: No Stress Concern Present (01/11/2023)   Received from [facility] - Occupational Stress Questionnaire    Feeling of Stress : Only a little  Social Connections: Not on file  Intimate Partner Violence: Not At Risk (01/11/2023)   Received from [facility]   HITS    Over the last 12 months how often did your partner physically hurt you?: Never    Over the last 12 months how often did your partner insult you or talk down to you?: Never    Over the last 12 months how often did your partner threaten you with physical harm?: Never    Over the last 12 months how often did your partner scream or curse at you?: Never    Review of Systems   General: Negative for anorexia, weight loss, fever, chills, fatigue, weakness. Eyes: Negative for vision changes.  ENT: Negative for hoarseness, difficulty swallowing , nasal congestion. CV: Negative for chest pain, angina, palpitations, dyspnea on exertion, peripheral edema.  Respiratory: Negative for dyspnea at rest, dyspnea on exertion, cough, sputum, wheezing.  GI: See history of present illness. GU:  Negative for dysuria, hematuria, urinary incontinence, urinary frequency, nocturnal urination.  MS: Negative for joint pain, low back pain.  Derm: Negative for rash or itching.  Neuro: Negative for weakness, abnormal sensation, seizure, frequent headaches, memory loss,  confusion.  Psych: Negative for anxiety, depression, suicidal ideation, hallucinations.  Endo: Negative for unusual weight change.   Heme: Negative for bruising or bleeding. Allergy: Negative for rash or hives.  Physical Exam   BP 130/79   Pulse 92   Temp 98.4 F (36.9 C)   Ht 4\' 11"  (1.499 m)   Wt 118 lb 3.2 oz (53.6 kg)   LMP 02/16/2023   BMI 23.87 kg/m    General: Well-nourished, well-developed in no acute distress.  Head: Normocephalic, atraumatic.   Eyes: Conjunctiva pink, no icterus. Mouth: Oropharyngeal mucosa moist and pink  Neck: Supple without thyromegaly, masses, or lymphadenopathy.  Lungs: Clear to auscultation bilaterally.  Heart: Regular rate and rhythm, no murmurs rubs or gallops.  Abdomen: Bowel sounds are normal, nontender, nondistended, no hepatosplenomegaly or masses,  no abdominal bruits or  hernia, no rebound or guarding.   Rectal: not performed Extremities: No lower extremity edema. No clubbing or deformities.  Neuro: Alert and oriented x 4 , grossly normal neurologically.  Skin: Warm and dry, no rash or jaundice.   Psych: Alert and cooperative, normal mood and affect.  Labs   See hpi  Imaging Studies   MM 3D DIAGNOSTIC MAMMOGRAM BILATERAL BREAST Result Date: 03/02/2023 CLINICAL DATA:  Delayed follow-up for probably benign right breast masses initially identified on screening mammography from 04/19/2017. EXAM: DIGITAL DIAGNOSTIC BILATERAL MAMMOGRAM WITH TOMOSYNTHESIS AND CAD TECHNIQUE: Bilateral digital diagnostic mammography and breast tomosynthesis was performed. The images were evaluated with computer-aided detection. COMPARISON:  Previous exam(s). ACR Breast Density Category c: The breasts are heterogeneously dense, which may obscure small masses. FINDINGS: No suspicious masses or calcifications are seen in either breast. Stable mammographic appearance of the bilateral breast dating back to 2019. There is no mammographic evidence of malignancy in either breast. IMPRESSION: No mammographic evidence of malignancy in either breast. RECOMMENDATION: Screening mammogram in one  year.(Code:[id number]-B-01Y) I have discussed the findings and recommendations with the patient. If applicable, a reminder letter will be sent to the patient regarding the next appointment. BI-RADS CATEGORY  1: Negative. Electronically Signed   By: Edwin Cap M.D.   On: 03/02/2023 11:36    Assessment/Plan:   Change in bowels/diarrhea for one year: -FH colon polyps, colon cancer -less likely infectious etiology given chronicity -screen for celiac, IBD -ultimately she will need colonoscopy due to change in bowel habits, FH of colon polyps (mother), first ever screening, await lab findings first to rule out celiac disease -continue loperamide 1/2 to 1 tablet daily as needed  -handout for IBS provided, not clearly IBS given recent onset of symptoms in her late 64s but may help with symptom control    Leanna Battles. Melvyn Neth, MHS, PA-C [facility]

## 2023-03-04 LAB — TISSUE TRANSGLUTAMINASE, IGA

## 2023-03-04 LAB — SEDIMENTATION RATE: Sed Rate: 38 mm/h — ABNORMAL HIGH (ref 0–32)

## 2023-03-04 LAB — C-REACTIVE PROTEIN: CRP: 16 mg/L — ABNORMAL HIGH (ref 0–10)

## 2023-03-04 LAB — IGA: IgA/Immunoglobulin A, Serum: <5 mg/dL — ABNORMAL LOW (ref 87–352)

## 2023-03-12 ENCOUNTER — Other Ambulatory Visit: Payer: Self-pay

## 2023-03-12 DIAGNOSIS — K529 Noninfective gastroenteritis and colitis, unspecified: Secondary | ICD-10-CM

## 2023-03-12 NOTE — Addendum Note (Signed)
 Addended by: Tiffany Kocher on: 03/12/2023 10:56 AM   Modules accepted: Orders

## 2023-03-12 NOTE — Progress Notes (Signed)
 Ok, thank you

## 2023-03-12 NOTE — Progress Notes (Signed)
 Heather Bolton, I changed the order to what I needed.

## 2023-03-14 LAB — TISSUE TRANSGLUTAMINASE, IGG: Tissue Transglut Ab: 7 U/mL — ABNORMAL HIGH (ref 0–5)

## 2023-03-19 LAB — GLIA (IGA/G) + TTG IGA
Antigliadin Abs, IgA: 3 U (ref 0–19)
Gliadin IgG: 2 U (ref 0–19)
Transglutaminase IgA: <2 U/mL (ref 0–3)

## 2023-03-26 ENCOUNTER — Other Ambulatory Visit: Payer: Self-pay | Admitting: Gastroenterology

## 2023-03-29 ENCOUNTER — Other Ambulatory Visit: Payer: Self-pay | Admitting: *Deleted

## 2023-03-29 ENCOUNTER — Encounter: Payer: Self-pay | Admitting: *Deleted

## 2023-03-29 DIAGNOSIS — K529 Noninfective gastroenteritis and colitis, unspecified: Secondary | ICD-10-CM

## 2023-03-29 DIAGNOSIS — Z800 Family history of malignant neoplasm of digestive organs: Secondary | ICD-10-CM

## 2023-03-29 DIAGNOSIS — Z83719 Family history of colon polyps, unspecified: Secondary | ICD-10-CM

## 2023-03-29 MED ORDER — PEG 3350-KCL-NA BICARB-NACL 420 G PO SOLR: 4000.0000 mL | Freq: Once | ORAL | 0 refills | Status: AC

## 2023-03-29 NOTE — Addendum Note (Signed)
 Addended by: Zada Finders on: 03/29/2023 02:47 PM   Modules accepted: Orders

## 2023-03-30 ENCOUNTER — Telehealth: Payer: Self-pay | Admitting: *Deleted

## 2023-03-30 NOTE — Telephone Encounter (Signed)
 Call pt's insurance and no prior authorization is needed for either code. [place] # [id number]

## 2023-04-17 LAB — PREGNANCY, URINE: Preg Test, Ur: NEGATIVE

## 2023-04-19 ENCOUNTER — Ambulatory Visit (HOSPITAL_COMMUNITY): Admitting: Anesthesiology

## 2023-04-19 ENCOUNTER — Encounter (HOSPITAL_COMMUNITY)
Admission: RE | Disposition: A | Discharge status: Home / Self Care | Payer: Self-pay | Source: Home / Self Care | Attending: Internal Medicine

## 2023-04-19 ENCOUNTER — Other Ambulatory Visit: Payer: Self-pay

## 2023-04-19 ENCOUNTER — Encounter (HOSPITAL_COMMUNITY): Payer: Self-pay | Admitting: Internal Medicine

## 2023-04-19 ENCOUNTER — Ambulatory Visit (HOSPITAL_COMMUNITY)
Admission: RE | Admit: 2023-04-19 | Discharge: 2023-04-19 | Disposition: A | Discharge status: Home / Self Care | Attending: Internal Medicine | Admitting: Internal Medicine

## 2023-04-19 DIAGNOSIS — R768 Other specified abnormal immunological findings in serum: Secondary | ICD-10-CM | POA: Diagnosis not present

## 2023-04-19 DIAGNOSIS — Z800 Family history of malignant neoplasm of digestive organs: Secondary | ICD-10-CM | POA: Diagnosis not present

## 2023-04-19 DIAGNOSIS — Z83719 Family history of colon polyps, unspecified: Secondary | ICD-10-CM | POA: Insufficient documentation

## 2023-04-19 DIAGNOSIS — K2980 Duodenitis without bleeding: Secondary | ICD-10-CM

## 2023-04-19 DIAGNOSIS — K529 Noninfective gastroenteritis and colitis, unspecified: Secondary | ICD-10-CM | POA: Insufficient documentation

## 2023-04-19 HISTORY — PX: ESOPHAGOGASTRODUODENOSCOPY: SHX5428

## 2023-04-19 HISTORY — PX: COLONOSCOPY: SHX5424

## 2023-04-19 SURGERY — COLONOSCOPY
Anesthesia: General

## 2023-04-19 MED ORDER — SODIUM CHLORIDE 0.9% FLUSH: 3.0000 mL | INTRAVENOUS | Status: DC | PRN

## 2023-04-19 MED ORDER — SODIUM CHLORIDE 0.9% FLUSH: 3.0000 mL | Freq: Two times a day (BID) | INTRAVENOUS | Status: DC

## 2023-04-19 MED ORDER — PROPOFOL 500 MG/50ML IV EMUL
INTRAVENOUS | Status: DC | PRN
  Administered 2023-04-19: 150 ug/kg/min via INTRAVENOUS

## 2023-04-19 MED ORDER — LIDOCAINE HCL (PF) 2 % IJ SOLN
INTRAMUSCULAR | Status: DC | PRN
Start: 2023-04-19 — End: 2023-04-19
  Administered 2023-04-19: 60 mg via INTRADERMAL

## 2023-04-19 MED ORDER — GLYCOPYRROLATE PF 0.2 MG/ML IJ SOSY
PREFILLED_SYRINGE | INTRAMUSCULAR | Status: DC | PRN
  Administered 2023-04-19: .1 mg via INTRAVENOUS

## 2023-04-19 MED ORDER — GLYCOPYRROLATE PF 0.2 MG/ML IJ SOSY
PREFILLED_SYRINGE | INTRAMUSCULAR | Status: AC
  Filled 2023-04-19: qty 1

## 2023-04-19 MED ORDER — LACTATED RINGERS IV SOLN: INTRAVENOUS | Status: DC | PRN

## 2023-04-19 MED ORDER — STERILE WATER FOR IRRIGATION IR SOLN
Status: DC | PRN
  Administered 2023-04-19: 60 mL

## 2023-04-19 MED ORDER — PROPOFOL 10 MG/ML IV BOLUS
INTRAVENOUS | Status: DC | PRN
Start: 2023-04-19 — End: 2023-04-19
  Administered 2023-04-19: 40 mg via INTRAVENOUS
  Administered 2023-04-19: 70 mg via INTRAVENOUS
  Administered 2023-04-19: 30 mg via INTRAVENOUS
  Administered 2023-04-19: 20 mg via INTRAVENOUS

## 2023-04-19 NOTE — Anesthesia Preprocedure Evaluation (Addendum)
 Anesthesia Evaluation  Patient identified by MRN, date of birth, ID band Patient awake    Reviewed: Allergy & Precautions, H&P , NPO status , Patient's Chart, lab work & pertinent test results, reviewed documented beta blocker date and time   Airway Mallampati: I  TM Distance: >3 FB Neck ROM: full    Dental no notable dental hx. (+) Dental Advisory Given, Teeth Intact   Pulmonary neg pulmonary ROS   Pulmonary exam normal breath sounds clear to auscultation       Cardiovascular Exercise Tolerance: Good negative cardio ROS Normal cardiovascular exam Rhythm:regular Rate:Normal     Neuro/Psych   Anxiety     negative neurological ROS  negative psych ROS   GI/Hepatic negative GI ROS, Neg liver ROS,,,  Endo/Other  negative endocrine ROS    Renal/GU negative Renal ROS  negative genitourinary   Musculoskeletal   Abdominal   Peds  Hematology negative hematology ROS (+)   Anesthesia Other Findings   Reproductive/Obstetrics negative OB ROS                             Anesthesia Physical Anesthesia Plan  ASA: 1  Anesthesia Plan: General   Post-op Pain Management: Minimal or no pain anticipated   Induction: Intravenous  PONV Risk Score and Plan: Propofol infusion  Airway Management Planned: Nasal Cannula and Natural Airway  Additional Equipment: None  Intra-op Plan:   Post-operative Plan:   Informed Consent: I have reviewed the patients History and Physical, chart, labs and discussed the procedure including the risks, benefits and alternatives for the proposed anesthesia with the patient or authorized representative who has indicated his/her understanding and acceptance.     Dental Advisory Given  Plan Discussed with: CRNA  Anesthesia Plan Comments:        Anesthesia Quick Evaluation

## 2023-04-19 NOTE — Anesthesia Postprocedure Evaluation (Signed)
 Anesthesia Post Note  Patient: Heather Bolton  Procedure(s) Performed: COLONOSCOPY EGD (ESOPHAGOGASTRODUODENOSCOPY)  Patient location during evaluation: PACU Anesthesia Type: General Level of consciousness: awake and alert Pain management: pain level controlled Vital Signs Assessment: post-procedure vital signs reviewed and stable Respiratory status: spontaneous breathing, nonlabored ventilation, respiratory function stable and patient connected to nasal cannula oxygen Cardiovascular status: blood pressure returned to baseline and stable Postop Assessment: no apparent nausea or vomiting Anesthetic complications: no   There were no known notable events for this encounter.   Last Vitals:  Vitals:   04/19/23 1057 04/19/23 1204  BP: 130/74 (!) 100/56  Pulse: 68 85  Resp: 17 20  Temp: 36.9 C (!) 36.4 C  SpO2: 99% 100%    Last Pain:  Vitals:   04/19/23 1204  TempSrc: Oral  PainSc: 0-No pain                 Moshe Wenger L Lacey Wallman

## 2023-04-19 NOTE — Op Note (Signed)
 [facility] Patient Name: Heather Bolton Procedure Date: 04/19/2023 11:01 AM MRN: [id number] Date of Birth: July 02, 1975 Attending MD: Gennette Pac , MD, [id number] CSN: [id number] Age: 48 Admit Type: Outpatient Procedure:                Upper GI endoscopy Indications:              Diarrhea; positive TTG IgA Providers:                Gennette Pac, MD, Crystal Page, Kristine L.                            Jessee Avers, Technician Referring MD:             Gennette Pac, MD Medicines:                Propofol per Anesthesia Complications:            No immediate complications. Estimated Blood Loss:     Estimated blood loss was minimal. Procedure:                Pre-Anesthesia Assessment:                           - Prior to the procedure, a History and Physical                            was performed, and patient medications and                            allergies were reviewed. The patient's tolerance of                            previous anesthesia was also reviewed. The risks                            and benefits of the procedure and the sedation                            options and risks were discussed with the patient.                            All questions were answered, and informed consent                            was obtained. Prior Anticoagulants: The patient has                            taken no anticoagulant or antiplatelet agents. ASA                            Grade Assessment: II - A patient with mild systemic                            disease. After reviewing the risks and benefits,  the patient was deemed in satisfactory condition to                            undergo the procedure.                           After obtaining informed consent, the endoscope was                            passed under direct vision. Throughout the                            procedure, the patient's blood pressure, pulse, and                             oxygen saturations were monitored continuously. The                            [id number]-[id number] ([id number]) scope was introduced through the                            mouth, and advanced to the third part of duodenum.                            The upper GI endoscopy was accomplished without                            difficulty. The patient tolerated the procedure                            well. Scope In: 11:30:37 AM Scope Out: 11:37:04 AM Total Procedure Duration: 0 hours 6 minutes 27 seconds  Findings:      The examined esophagus was normal.      The entire examined stomach was normal.      The second portion of the duodenum and third portion of the duodenum       were normal. Prominent hyperplastic lymphoid tissue present in the bulb.       Biopsies of the bulb second third portion taken for histologic study Impression:               - Normal esophagus.                           - Normal stomach.                           - Normal second portion of the duodenum and third                            portion of the duodenum.                           -Status post duodenal biopsy. Moderate Sedation:      Moderate (conscious) sedation was personally administered by an       anesthesia professional. The following parameters were monitored: oxygen  saturation, heart rate, blood pressure, respiratory rate, EKG, adequacy       of pulmonary ventilation, and response to care. Recommendation:           - Patient has a contact number available for                            emergencies. The signs and symptoms of potential                            delayed complications were discussed with the                            patient. Return to normal activities tomorrow.                            Written discharge instructions were provided to the                            patient.                           - Advance diet as tolerated. Follow-up on biopsy.                             Seek colonoscopy report                           - Continue present medications.                           - Return to my office in 6 weeks. Procedure Code(s):        --- Professional ---                           [phone], Esophagogastroduodenoscopy, flexible,                            transoral; diagnostic, including collection of                            specimen(s) by brushing or washing, when performed                            (separate procedure) Diagnosis Code(s):        --- Professional ---                           R19.7, Diarrhea, unspecified CPT copyright 2022 American Medical Association. All rights reserved. The codes documented in this report are preliminary and upon coder review may  be revised to meet current compliance requirements. Gerrit Friends. Verna Desrocher, MD Gennette Pac, MD 04/19/2023 12:01:38 PM This report has been signed electronically. Number of Addenda: 0

## 2023-04-19 NOTE — Addendum Note (Signed)
 Addendum  created 04/19/23 1413 by Oletha Cruel, CRNA   Clinical Note Signed

## 2023-04-19 NOTE — H&P (Signed)
 Primary Care Physician:  Roe Rutherford, NP Primary Gastroenterologist:  Dr. Jena Gauss  Pre-Procedure History & Physical: HPI:  Heather Bolton is a 48 y.o. female here for   Chronic diarrhea.  Positive family history of colon polyps and colon cancer.  TTG IgA is elevated.   No dysphagia.  EGD and colonoscopy today.  Past Medical History:  Diagnosis Date   Anxiety     Past Surgical History:  Procedure Laterality Date   GANGLION CYST EXCISION Left 01/19/1998   WISDOM TOOTH EXTRACTION      Prior to Admission medications   Medication Sig Start Date End Date Taking? Authorizing Provider  Multiple Vitamin (MULTIVITAMIN) tablet Take 1 tablet by mouth daily.     Yes [provider]  norgestrel-ethinyl estradiol (LO/OVRAL) 0.3-30 MG-MCG tablet Take 1 tablet by mouth daily.   Yes [provider]    Allergies as of 03/29/2023   (No Known Allergies)    Family History  Problem Relation Age of Onset   Hyperlipidemia Mother    Stroke Mother 76   Colon polyps Mother    Diabetes Father    Cancer Maternal Grandmother 64       colon   Stroke Paternal Grandmother    Cancer Paternal Grandfather 46       lung; remote tobacco history   Stroke Paternal Grandfather     Social History   Socioeconomic History   Marital status: Single    Spouse name: n/a   Number of children: 0   Years of education: 12th grade   Highest education level: Not on file  Occupational History   Occupation: Electronics engineer: [employer]  Tobacco Use   Smoking status: Never   Smokeless tobacco: Never  Substance and Sexual Activity   Alcohol use: No   Drug use: No   Sexual activity: Never  Other Topics Concern   Not on file  Social History Narrative   Her mother lives with her.   Social Drivers of Corporate investment banker Strain: Low Risk  (01/11/2023)   Received from [facility]   Overall Financial Resource Strain (CARDIA)    Difficulty of Paying Living Expenses: Not very  hard  Food Insecurity: No Food Insecurity (01/11/2023)   Received from [facility]   Hunger Vital Sign    Worried About Running Out of Food in the Last Year: Never true    Ran Out of Food in the Last Year: Never true  Transportation Needs: No Transportation Needs (01/11/2023)   Received from [facility] - Transportation    Lack of Transportation (Medical): No    Lack of Transportation (Non-Medical): No  Physical Activity: Unknown (01/11/2023)   Received from [facility]   Exercise Vital Sign    Days of Exercise per Week: Patient declined    Minutes of Exercise per Session: Not on file  Stress: No Stress Concern Present (01/11/2023)   Received from [facility] - Occupational Stress Questionnaire    Feeling of Stress : Only a little  Social Connections: Not on file  Intimate Partner Violence: Not At Risk (01/11/2023)   Received from [facility]   HITS    Over the last 12 months how often did your partner physically hurt you?: Never    Over the last 12 months how often did your partner insult you or talk down to you?: Never    Over the last 12 months  how often did your partner threaten you with physical harm?: Never    Over the last 12 months how often did your partner scream or curse at you?: Never    Review of Systems: See HPI, otherwise negative ROS  Physical Exam:  General:   Alert,  Well-developed, well-nourished, pleasant and cooperative in NAD No significant cervical adenopathy. Lungs:  Clear throughout to auscultation.   No wheezes, crackles, or rhonchi. No acute distress. Heart:  Regular rate and rhythm; no murmurs, clicks, rubs,  or gallops. Abdomen: Non-distended, normal bowel sounds.  Soft and nontender without appreciable mass or hepatosplenomegaly.    Impression/Plan:    48 year old lady here for further evaluation of chronic diarrhea.  TTG IgA elevated.  EGD and colonoscopy today per plan. The risks,  benefits, limitations, imponderables and alternatives regarding both EGD and colonoscopy have been reviewed with the patient. Questions have been answered. All parties agreeable.       Notice: This dictation was prepared with Dragon dictation along with smaller phrase technology. Any transcriptional errors that result from this process are unintentional and may not be corrected upon review.

## 2023-04-19 NOTE — Discharge Instructions (Addendum)
 EGD Discharge instructions Please read the instructions outlined below and refer to this sheet in the next few weeks. These discharge instructions provide you with general information on caring for yourself after you leave the hospital. Your doctor may also give you specific instructions. While your treatment has been planned according to the most current medical practices available, unavoidable complications occasionally occur. If you have any problems or questions after discharge, please call your doctor. ACTIVITY You may resume your regular activity but move at a slower pace for the next 24 hours.  Take frequent rest periods for the next 24 hours.  Walking will help expel (get rid of) the air and reduce the bloated feeling in your abdomen.  No driving for 24 hours (because of the anesthesia (medicine) used during the test).  You may shower.  Do not sign any important legal documents or operate any machinery for 24 hours (because of the anesthesia used during the test).  NUTRITION Drink plenty of fluids.  You may resume your normal diet.  Begin with a light meal and progress to your normal diet.  Avoid alcoholic beverages for 24 hours or as instructed by your caregiver.  MEDICATIONS You may resume your normal medications unless your caregiver tells you otherwise.  WHAT YOU CAN EXPECT TODAY You may experience abdominal discomfort such as a feeling of fullness or "gas" pains.  FOLLOW-UP Your doctor will discuss the results of your test with you.  SEEK IMMEDIATE MEDICAL ATTENTION IF ANY OF THE FOLLOWING OCCUR: Excessive nausea (feeling sick to your stomach) and/or vomiting.  Severe abdominal pain and distention (swelling).  Trouble swallowing.  Temperature over 101 F (37.8 C).  Rectal bleeding or vomiting of blood.    Colonoscopy Discharge Instructions  Read the instructions outlined below and refer to this sheet in the next few weeks. These discharge instructions provide you with  general information on caring for yourself after you leave the hospital. Your doctor may also give you specific instructions. While your treatment has been planned according to the most current medical practices available, unavoidable complications occasionally occur. If you have any problems or questions after discharge, call Dr. Jena Gauss at [phone]. ACTIVITY You may resume your regular activity, but move at a slower pace for the next 24 hours.  Take frequent rest periods for the next 24 hours.  Walking will help get rid of the air and reduce the bloated feeling in your belly (abdomen).  No driving for 24 hours (because of the medicine (anesthesia) used during the test).   Do not sign any important legal documents or operate any machinery for 24 hours (because of the anesthesia used during the test).  NUTRITION Drink plenty of fluids.  You may resume your normal diet as instructed by your doctor.  Begin with a light meal and progress to your normal diet. Heavy or fried foods are harder to digest and may make you feel sick to your stomach (nauseated).  Avoid alcoholic beverages for 24 hours or as instructed.  MEDICATIONS You may resume your normal medications unless your doctor tells you otherwise.  WHAT YOU CAN EXPECT TODAY Some feelings of bloating in the abdomen.  Passage of more gas than usual.  Spotting of blood in your stool or on the toilet paper.  IF YOU HAD POLYPS REMOVED DURING THE COLONOSCOPY: No aspirin products for 7 days or as instructed.  No alcohol for 7 days or as instructed.  Eat a soft diet for the next 24 hours.  FINDING  OUT THE RESULTS OF YOUR TEST Not all test results are available during your visit. If your test results are not back during the visit, make an appointment with your caregiver to find out the results. Do not assume everything is normal if you have not heard from your caregiver or the medical facility. It is important for you to follow up on all of your test  results.  SEEK IMMEDIATE MEDICAL ATTENTION IF: You have more than a spotting of blood in your stool.  Your belly is swollen (abdominal distention).  You are nauseated or vomiting.  You have a temperature over 101.  You have abdominal pain or discomfort that is severe or gets worse throughout the day.        Your upper GI tract appeared normal.  Your small intestine was biopsied.  Your colon appeared normal  - biopsies taken  The end of your small intestine on the other end appeared very abnormal with ulceration and marked inflammation.  You have ileitis.  Possibly Crohn's disease.  Further recommendations to follow pending biopsy report   repeat colonoscopy for colon cancer screening in 5 years.

## 2023-04-19 NOTE — Transfer of Care (Addendum)
 Immediate Anesthesia Transfer of Care Note  Patient: Heather Bolton  Procedure(s) Performed: COLONOSCOPY EGD (ESOPHAGOGASTRODUODENOSCOPY)  Patient Location: Endoscopy Unit  Anesthesia Type:General  Level of Consciousness: drowsy and patient cooperative  Airway & Oxygen Therapy: Patient Spontanous Breathing  Post-op Assessment: Report given to RN and Post -op Vital signs reviewed and stable  Post vital signs: Reviewed and stable  Last Vitals:  Vitals Value Taken Time  BP 100/56 04/19/23   1204  Temp 36.4 04/19/23   1204  Pulse 85 04/19/23   1204  Resp 20 04/19/23   1204  SpO2 100% 04/19/23   1204    Last Pain:  Vitals:   04/19/23 1122  TempSrc:   PainSc: 0-No pain      Patients Stated Pain Goal: 7 (04/19/23 1057)  Complications: No notable events documented.

## 2023-04-19 NOTE — Op Note (Signed)
 [facility] Patient Name: Heather Bolton Procedure Date: 04/19/2023 11:38 AM MRN: [id number] Date of Birth: April 16, 1975 Attending MD: Gennette Pac , MD, [id number] CSN: [id number] Age: 48 Admit Type: Outpatient Procedure:                Colonoscopy Indications:              Chronic diarrhea Providers:                Gennette Pac, MD, Crystal Page, Kristine L.                            Jessee Avers, Technician Referring MD:             Gennette Pac, MD Medicines:                Propofol per Anesthesia Complications:            No immediate complications. Estimated Blood Loss:     Estimated blood loss was minimal. Procedure:                Pre-Anesthesia Assessment:                           - Prior to the procedure, a History and Physical                            was performed, and patient medications and                            allergies were reviewed. The patient's tolerance of                            previous anesthesia was also reviewed. The risks                            and benefits of the procedure and the sedation                            options and risks were discussed with the patient.                            All questions were answered, and informed consent                            was obtained. Prior Anticoagulants: The patient has                            taken no anticoagulant or antiplatelet agents. ASA                            Grade Assessment: II - A patient with mild systemic                            disease. After reviewing the risks and benefits,  the patient was deemed in satisfactory condition to                            undergo the procedure.                           After obtaining informed consent, the colonoscope                            was passed under direct vision. Throughout the                            procedure, the patient's blood pressure, pulse, and                             oxygen saturations were monitored continuously. The                            [id number]) scope was introduced through the                            anus and advanced to the 5 cm into the ileum. The                            colonoscopy was performed without difficulty. The                            patient tolerated the procedure well. The quality                            of the bowel preparation was adequate. The terminal                            ileum, ileocecal valve, appendiceal orifice, and                            rectum were photographed. The patient tolerated the                            procedure well. Scope In: 11:41:44 AM Scope Out: 11:59:05 AM Scope Withdrawal Time: 0 hours 9 minutes 27 seconds  Total Procedure Duration: 0 hours 17 minutes 21 seconds  Findings:      The perianal and digital rectal examinations were normal.      The colon (entire examined portion) appeared normal. Distal 5 cm of TI       markedly abnormal with cobblestoning, ulceration diffusely. Biopsies       taken. See photographs.      The retroflexed view of the distal rectum and anal verge was normal and       showed no anal or rectal abnormalities. Segmental biopsies right and       left colon taken. Impression:               - The entire examined colon is normal. Abnormal TI  suggestive of moderately severe ileitis. Status                            post biopsy.                           - The distal rectum and anal verge are normal on                            retroflexion view. Status post segmental colon                            biopsy. Moderate Sedation:      Moderate (conscious) sedation was personally administered by an       anesthesia professional. The following parameters were monitored: oxygen       saturation, heart rate, blood pressure, respiratory rate, EKG, adequacy       of pulmonary ventilation, and response to  care. Recommendation:           - Patient has a contact number available for                            emergencies. The signs and symptoms of potential                            delayed complications were discussed with the                            patient. Return to normal activities tomorrow.                            Written discharge instructions were provided to the                            patient.                           - Advance diet as tolerated. Follow-up on                            pathology. Continue present medications. See EGD                            report. Office visit with [place] in 6 weeks Procedure Code(s):        --- Professional ---                           [phone], Colonoscopy, flexible; diagnostic, including                            collection of specimen(s) by brushing or washing,                            when performed (separate procedure) Diagnosis Code(s):        --- Professional ---  K52.9, Noninfective gastroenteritis and colitis,                            unspecified CPT copyright 2022 American Medical Association. All rights reserved. The codes documented in this report are preliminary and upon coder review may  be revised to meet current compliance requirements. Gerrit Friends. Richie Vadala, MD Gennette Pac, MD 04/19/2023 12:06:35 PM This report has been signed electronically. Number of Addenda: 0

## 2023-04-20 ENCOUNTER — Encounter (HOSPITAL_COMMUNITY): Payer: Self-pay | Admitting: Internal Medicine

## 2023-04-20 LAB — SURGICAL PATHOLOGY

## 2023-04-23 LAB — CELIAC DISEASE HLA DQ ASSOC.
DQ2 (DQA1 0501/0505,DQB1 02XX): POSITIVE
DQ8 (DQA1 03XX, DQB1 0302): NEGATIVE

## 2023-04-26 ENCOUNTER — Telehealth: Payer: Self-pay | Admitting: Internal Medicine

## 2023-04-26 ENCOUNTER — Other Ambulatory Visit: Payer: Self-pay

## 2023-04-26 MED ORDER — BUDESONIDE 3 MG PO CPEP: 9.0000 mg | ORAL_CAPSULE | Freq: Every day | ORAL | 3 refills | Status: DC

## 2023-04-26 NOTE — Telephone Encounter (Signed)
 Pt was made aware and verbalized understanding.

## 2023-04-26 NOTE — Telephone Encounter (Signed)
 Rx was sent to pharmacy on file. Sent pt a MyChart message requesting a return call. Looks like [place], [place], and [place] are covered through [employer].

## 2023-04-26 NOTE — Telephone Encounter (Signed)
 Looks like we have some decent options. thanks

## 2023-04-26 NOTE — Telephone Encounter (Signed)
 noted

## 2023-04-26 NOTE — Telephone Encounter (Signed)
 Heather Bolton, please reach out to Ms. Motter and let her know no evidence of celiac disease on small bowel biopsies.  No evidence of microscopic colitis on colon biopsies.  Patient has significant ileitis.  Likely Crohn's disease if not taking any excessive nonsteroidals.  If she is taking nonsteroidals tell her to back off.  Begin new prescription Entocort 9 mg daily.  Dispense 90 with 3 refills take (3) 3 mg tablets daily x 6 weeks.  Hopefully, budesonide will cool down her ileum.  Appointment with [facility] in 6 weeks to reassess and discuss long-term management with a biologic agent.  Heather Bolton, please query her insurance company for preferred agents.  Thanks.

## 2023-04-27 ENCOUNTER — Other Ambulatory Visit: Payer: Self-pay

## 2023-04-27 MED ORDER — BUDESONIDE ER 9 MG PO TB24: 9.0000 mg | ORAL_TABLET | Freq: Every day | ORAL | 6 refills | Status: DC

## 2023-06-08 ENCOUNTER — Encounter: Payer: Self-pay | Admitting: Gastroenterology

## 2023-06-08 ENCOUNTER — Ambulatory Visit: Admitting: Gastroenterology

## 2023-06-08 VITALS — BP 122/83 | HR 71 | Temp 98.4°F | Ht 60.0 in | Wt 127.8 lb

## 2023-06-08 DIAGNOSIS — K5000 Crohn's disease of small intestine without complications: Secondary | ICD-10-CM | POA: Insufficient documentation

## 2023-06-08 NOTE — Progress Notes (Signed)
 GI Office Note    Referring Provider: Lorre Rosin, NP Primary Care Physician:  Lorre Rosin, NP  Primary Gastroenterologist:Michael Riley Cheadle, MD   Chief Complaint   Chief Complaint  Patient presents with   Follow-up    Wants to know if budesonide  is known to cause reflux    History of Present Illness   Heather Bolton is a 48 y.o. female presenting today for follow up. Initially seen 02/2023. She has history of chronic diarrhea for over a year. She completed colonoscopy recently with evidence of ileitis suspected to be due to Crohn's disease. No personal history of NSAID use or other medications that could have resulted in similar findings. She is brought back in today to discuss biologic therapy.   She has been on budesonide  9mg  daily for about 6 weeks. She used to have 5-6 loose stools daily associated with some rectal bleeding. Now she may have 3 stools a day but some days without BM. No further blood in the stool. She is learning to avoid trigger foods. Ate a poptart the other day and had bad diarrhea and abdominal cramps. Most of her abdominal pain is just before having a BM. No N/V. She continues to eat gluten. She has had some reflux since on budesonide .  Labs in 02/2023: IgA <5. TTG IgA <2. Sed rate 38H, CRP 16H, antigliadin Ab, Iga 3, TTG IgG 7, weak positive. DQ2 positive. DQ 8 negative. Duodenal biopsy on EGD showed peptic duodenitis. Continue to eat gluten.   She has never had positive TB skin test. She goes to gyn yearly. Does not see dermatology. Last tetanus in 2017. Had chicken pox at age 89. Received all typical childhood vaccinations. Does not receive flu or covid vaccines. She believes covid shot may have resulted in her father's death.       Wt Readings from Last 3 Encounters:  06/08/23 127 lb 12.8 oz (58 kg)  04/19/23 117 lb (53.1 kg)  03/02/23 118 lb 3.2 oz (53.6 kg)    Colonoscopy 03/2023: -colon normal -TI s/o moderately severe ileitis, bx most  c/w Crohn's unless taking excessive NSAIDs. -s/p segmental bx of colon, benign  EGD 03/2023: -normal esophagus, stomach, duodenu -s/p duodenal bx, features of chronic peptic duodenitis no evidence of celiac disease  Medications   Current Outpatient Medications  Medication Sig Dispense Refill   Budesonide  ER 9 MG TB24 Take 1 tablet (9 mg total) by mouth daily. 30 tablet 6   Multiple Vitamin (MULTIVITAMIN) tablet Take 1 tablet by mouth daily.       norgestrel-ethinyl estradiol (LO/OVRAL) 0.3-30 MG-MCG tablet Take 1 tablet by mouth daily.     No current facility-administered medications for this visit.    Allergies   Allergies as of 06/08/2023   (No Known Allergies)           Review of Systems   General: Negative for anorexia, weight loss, fever, chills, fatigue, weakness. ENT: Negative for hoarseness, difficulty swallowing , nasal congestion. CV: Negative for chest pain, angina, palpitations, dyspnea on exertion, peripheral edema.  Respiratory: Negative for dyspnea at rest, dyspnea on exertion, cough, sputum, wheezing.  GI: See history of present illness. GU:  Negative for dysuria, hematuria, urinary incontinence, urinary frequency, nocturnal urination.  Endo: Negative for unusual weight change.     Physical Exam   BP 122/83 (BP Location: Right Arm, Patient Position: Sitting, Cuff Size: Normal)   Pulse 71   Temp 98.4 F (36.9 C) (Oral)   Ht  5' (1.524 m)   Wt 127 lb 12.8 oz (58 kg)   LMP  (LMP Unknown)   SpO2 99%   BMI 24.96 kg/m    General: Well-nourished, well-developed in no acute distress.  Eyes: No icterus. Mouth: Oropharyngeal mucosa moist and pink  Abdomen: Bowel sounds are normal, nontender, nondistended, no hepatosplenomegaly or masses,  no abdominal bruits or hernia , no rebound or guarding.  Rectal: not performed  Extremities: No lower extremity edema. No clubbing or deformities. Neuro: Alert and oriented x 4   Skin: Warm and dry, no jaundice.    Psych: Alert and cooperative, normal mood and affect.  Labs   See hpi  Imaging Studies   No results found.  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 We discussed potential biologic options, risks and benefits.  -update labs in preparation for starting biologic -continue budesonide  9mg  daily for now -she was made aware of need for yearly paps, if 3 consecutive negatives, then continue pap every 3 years. -she will need yearly dermatology exam.  -encouraged her to go to the Crohn's and Colitis foundation website to learn more regarding Crohn's.   Trudie Fuse. Harles Lied, MHS, PA-C [facility]

## 2023-06-08 NOTE — Patient Instructions (Addendum)
 Please complete labs. We will be in touch with results as available.  After starting medication for Crohn's disease, you should have annual skin exam by dermatologist. You should have yearly pap smear the first three years on biologic medication, if 3 consecutive normal pap smears, then you can have pap smear once every 3 years.  Continue budesonide  9mg  daily for now. Use TUMS for reflux.  Check out crohn's and colitis foundation on the internet.[email or web address]

## 2023-06-10 ENCOUNTER — Ambulatory Visit: Payer: Self-pay | Admitting: Gastroenterology

## 2023-06-12 LAB — COMPREHENSIVE METABOLIC PANEL WITH GFR
ALT: 13 IU/L (ref 0–32)
AST: 13 IU/L (ref 0–40)
Albumin: 4.2 g/dL (ref 3.9–4.9)
Alkaline Phosphatase: 79 IU/L (ref 44–121)
BUN/Creatinine Ratio: 16 (ref 9–23)
BUN: 12 mg/dL (ref 6–24)
Bilirubin Total: 0.7 mg/dL (ref 0.0–1.2)
CO2: 20 mmol/L (ref 20–29)
Calcium: 9.4 mg/dL (ref 8.7–10.2)
Chloride: 103 mmol/L (ref 96–106)
Creatinine, Ser: 0.74 mg/dL (ref 0.57–1.00)
Globulin, Total: 3.2 g/dL (ref 1.5–4.5)
Glucose: 76 mg/dL (ref 70–99)
Potassium: 3.9 mmol/L (ref 3.5–5.2)
Sodium: 140 mmol/L (ref 134–144)
Total Protein: 7.4 g/dL (ref 6.0–8.5)
eGFR: 99 mL/min/{1.73_m2} (ref >59)

## 2023-06-12 LAB — HEPATITIS B CORE ANTIBODY, TOTAL: Hep B Core Total Ab: NEGATIVE

## 2023-06-12 LAB — CBC WITH DIFFERENTIAL/PLATELET
Basophils Absolute: 0 10*3/uL (ref 0.0–0.2)
Basos: 0 %
EOS (ABSOLUTE): 0 10*3/uL (ref 0.0–0.4)
Eos: 0 %
Hematocrit: 47.7 % — ABNORMAL HIGH (ref 34.0–46.6)
Hemoglobin: 15.7 g/dL (ref 11.1–15.9)
Immature Grans (Abs): 0 10*3/uL (ref 0.0–0.1)
Immature Granulocytes: 0 %
Lymphocytes Absolute: 1.1 10*3/uL (ref 0.7–3.1)
Lymphs: 11 %
MCH: 31.1 pg (ref 26.6–33.0)
MCHC: 32.9 g/dL (ref 31.5–35.7)
MCV: 95 fL (ref 79–97)
Monocytes Absolute: 0.4 10*3/uL (ref 0.1–0.9)
Monocytes: 4 %
Neutrophils Absolute: 7.8 10*3/uL — ABNORMAL HIGH (ref 1.4–7.0)
Neutrophils: 85 %
Platelets: 350 10*3/uL (ref 150–450)
RBC: 5.05 x10E6/uL (ref 3.77–5.28)
RDW: 13 % (ref 11.7–15.4)
WBC: 9.3 10*3/uL (ref 3.4–10.8)

## 2023-06-12 LAB — QUANTIFERON-TB GOLD PLUS
QuantiFERON Mitogen Value: >10 [IU]/mL
QuantiFERON Nil Value: 0.04 [IU]/mL
QuantiFERON TB1 Ag Value: 0.04 [IU]/mL
QuantiFERON TB2 Ag Value: 0.05 [IU]/mL
QuantiFERON-TB Gold Plus: NEGATIVE

## 2023-06-12 LAB — HEPATITIS A ANTIBODY, TOTAL: hep A Total Ab: NEGATIVE

## 2023-06-12 LAB — VARICELLA ZOSTER ANTIBODY, IGG: Varicella zoster IgG: REACTIVE

## 2023-06-12 LAB — HEPATITIS B SURFACE ANTIBODY,QUALITATIVE: Hep B Surface Ab, Qual: NONREACTIVE

## 2023-06-12 LAB — HEPATITIS B SURFACE ANTIGEN: Hepatitis B Surface Ag: NEGATIVE

## 2023-08-24 ENCOUNTER — Telehealth: Payer: Self-pay

## 2023-08-24 NOTE — Telephone Encounter (Signed)
 Received letter via fax stating that the pt has been approved for the [facility] for me program. Pt will receive first injection on 08/24/2023. Waiting to hear back from pt to determine when induction dose was done. Waiting to hear back from Rep Anika regarding whether pt needs to redo induction due to time it has been since induction dose.

## 2023-08-24 NOTE — Telephone Encounter (Signed)
 Spoke with [facility] and pt is scheduled to receive the second induction dose of 400 mg (2 x 200 mg pens) on 8/5 and then will receive a 3rd induction dose of 400 mg (2 x 200 mg) in 4 weeks. Pt took first induction dose on 07/02/2023 and has not had any follow up labs or follow up ov since. Pt has been instructed to hold off on taking second induction dose until further instructions have been given when Sonny Kerns PA-C is back in the office. Spoke with [facility] physician on the phone and he stated that he will email provider Sonny Kerns PA-C with more information on Tremfya dosing.

## 2023-08-31 ENCOUNTER — Other Ambulatory Visit: Payer: Self-pay

## 2023-08-31 DIAGNOSIS — K5000 Crohn's disease of small intestine without complications: Secondary | ICD-10-CM

## 2023-09-02 ENCOUNTER — Ambulatory Visit: Payer: Self-pay | Admitting: Gastroenterology

## 2023-09-02 LAB — HEPATIC FUNCTION PANEL
ALT: 18 IU/L (ref 0–32)
AST: 19 IU/L (ref 0–40)
Albumin: 4.2 g/dL (ref 3.9–4.9)
Alkaline Phosphatase: 64 IU/L (ref 44–121)
Bilirubin Total: 0.4 mg/dL (ref 0.0–1.2)
Bilirubin, Direct: 0.11 mg/dL (ref 0.00–0.40)
Total Protein: 6.8 g/dL (ref 6.0–8.5)

## 2023-09-02 NOTE — Telephone Encounter (Signed)
 Heather Bolton, I looked at [facility] email and spoke with medical liaison. At this time the initial first loading dose is completely out of her system and she will need to start over.   Someone from [facility] is supposed to call you next week to determine why there was a lapse in treatment.   We need to to make sure she is all approved for 3 induction doses and maintenance dosing before we have her restart. She also needs to inform us  if she is receiving medication from [facility] or her pharmacy so that we know what is happening.   So induction: 400mg  [name] on week 0,4,8.  Maintenance: at week 12 (four weeks after completed 3rd induction dose) start 200mg  [name] every 4 weeks.   Her LFTs yesterday were normal.  She needs LFTs 2 weeks after her next dose of Tremfya . She needs ov six weeks after her next dose of Tremfya .  Is she enrolled in tremfya  with me?

## 2023-09-02 NOTE — Telephone Encounter (Signed)
 See separate telephone note. She will need to restart tremfya  induction.

## 2023-09-06 ENCOUNTER — Other Ambulatory Visit: Payer: Self-pay

## 2023-09-06 MED ORDER — TREMFYA CROHNS INDUCTION 200 MG/2ML ~~LOC~~ SOAJ: 400.0000 mg | Freq: Once | SUBCUTANEOUS | 0 refills | Status: AC

## 2023-09-06 NOTE — Telephone Encounter (Signed)
 Please send information to patient. And make sure she is comfortable on how to work the pen.    Restart Tremfya  induction: Tremfya  400mg  subcutaneously in the skin on week 0, week 4, and week 8.   Maintenance:  At week 12 (4 weeks after completion of 3rd induction dose), start 200mg  subcutaneously in the skin every 4 weeks.   Labs: Repeat LFTs 2 weeks after next dose of Tremfya .   Needs office visit in six weeks.

## 2023-09-06 NOTE — Telephone Encounter (Signed)
 Spoke with [facility] pharmacist and was instructed to send in an electronic Rx for the third induction dose along with a note stating that the pt went without the medication for a long period of time and the induction dose needs to be re-initiated. Rx was sent to [facility] along with requesting instructions.

## 2023-09-07 ENCOUNTER — Other Ambulatory Visit: Payer: Self-pay

## 2023-09-07 DIAGNOSIS — K5000 Crohn's disease of small intestine without complications: Secondary | ICD-10-CM

## 2023-09-07 NOTE — Telephone Encounter (Signed)
 Pt was made aware, lab was ordered, pt was instructed to have done in 2 weeks and f/u appt was made.

## 2023-09-22 ENCOUNTER — Ambulatory Visit: Payer: Self-pay | Admitting: Gastroenterology

## 2023-09-22 ENCOUNTER — Other Ambulatory Visit: Payer: Self-pay

## 2023-09-22 DIAGNOSIS — K5000 Crohn's disease of small intestine without complications: Secondary | ICD-10-CM

## 2023-09-22 LAB — HEPATIC FUNCTION PANEL
ALT: 15 IU/L (ref 0–32)
AST: 14 IU/L (ref 0–40)
Albumin: 4.2 g/dL (ref 3.9–4.9)
Alkaline Phosphatase: 65 IU/L (ref 44–121)
Bilirubin Total: 0.5 mg/dL (ref 0.0–1.2)
Bilirubin, Direct: 0.13 mg/dL (ref 0.00–0.40)
Total Protein: 7.1 g/dL (ref 6.0–8.5)

## 2023-09-30 ENCOUNTER — Other Ambulatory Visit: Payer: Self-pay

## 2023-09-30 DIAGNOSIS — K5000 Crohn's disease of small intestine without complications: Secondary | ICD-10-CM

## 2023-10-01 NOTE — Telephone Encounter (Signed)
 Spoke with pt and she stated that she took induction week 0 on 09/07/2023 and is due to take induction week 4 on 10/05/2023. Pt will be receiving her week 4 induction dose today 10/01/2023. Pt has a f/u appt on 10/19/2023 and has been mailed f/u lab orders to be done in Oct.

## 2023-10-07 NOTE — Telephone Encounter (Signed)
 Noted. So we are good correct? On track with induction. I believe this came up due to concern she was behind on getting her second dose?

## 2023-10-07 NOTE — Telephone Encounter (Signed)
 Yes ma'am she is on track

## 2023-10-19 ENCOUNTER — Ambulatory Visit (INDEPENDENT_AMBULATORY_CARE_PROVIDER_SITE_OTHER): Payer: Self-pay | Admitting: Gastroenterology

## 2023-10-19 VITALS — BP 130/84 | HR 91 | Temp 98.5°F | Ht 59.0 in | Wt 140.8 lb

## 2023-10-19 DIAGNOSIS — K5000 Crohn's disease of small intestine without complications: Secondary | ICD-10-CM

## 2023-10-19 NOTE — Patient Instructions (Addendum)
 You are due your next labs within the next week. You can go at any time.  Continue Tremfya  induction, last dose 400mg  in October (week 8). Then you will start maintenance dose 200mg  at week 12 and continue every 4 weeks.   Please reach out in 6 weeks and let me know how you are doing.

## 2023-10-19 NOTE — Progress Notes (Signed)
 GI Office Note    Referring Provider: Suanne Pfeiffer, NP Primary Care Physician:  Suanne Pfeiffer, NP  Primary Gastroenterologist: Ozell Hollingshead, MD   Chief Complaint   Chief Complaint  Patient presents with   Follow-up    Follow up on Crohn's disease. Pt states good days and bad days    History of Present Illness   Heather Bolton is a 48 y.o. female presenting today for follow up. Last seen 05/2023. She has history of chronic diarrhea, noted to have moderately severe ileitis most c/w Crohn's disease (no NSAID history) earlier this year. She responded positively to budesonide .    Discussed the use of AI scribe software for clinical note transcription with the patient, who gave verbal consent to proceed.   She has been undergoing treatment for Crohn's disease with Tremfya . Initially, she started the induction phase in June with two 200 mg pens, but due to insurance delays, she had to restart the induction phase. She received two pens on August 19th and two more on September 16th. She is scheduled for one more induction dose before transitioning to maintenance therapy. Her symptoms have been variable, with good days and bad days.  On bad days, she experiences between four to eight bowel movements, which are either watery or 'soft serve' in consistency, with no solid stools. No blood in the stool. She experiences significant abdominal pain on bad days, describing it as feeling like being 'stabbed', which can wake her from sleep and requires immediate access to a bathroom. Bad days occur about 25% of the time, but they are severe. Denies vomiting, heartburn. No melena, brbpr.  She recently quit smoking 90 days ago, which has resulted in a 20-pound weight gain. She feels bloated and has had to adjust her clothing size. 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        Prior Data    Labs in 02/2023: IgA <5. TTG IgA <2. Sed rate 38H, CRP 16H, antigliadin Ab, Iga 3, TTG IgG 7, weak positive. DQ2 positive. DQ 8 negative. Duodenal biopsy on EGD showed peptic duodenitis. Continued to eat gluten.   Colonoscopy 03/2023: -colon normal -TI s/o moderately severe ileitis, bx most c/w Crohn's unless taking excessive NSAIDs. -s/p segmental bx of colon, benign   EGD 03/2023: -normal esophagus, stomach, duodenu -s/p duodenal bx, features of chronic peptic duodenitis no evidence of celiac disease   Medications   Current Outpatient Medications  Medication Sig Dispense Refill   Guselkumab  (TREMFYA ) 200 MG/2ML SOSY Inject 200 mg into the skin.     Multiple Vitamin (MULTIVITAMIN) tablet Take 1 tablet by mouth daily.       norgestrel-ethinyl estradiol (LO/OVRAL) 0.3-30 MG-MCG tablet Take 1 tablet by mouth daily.     No current facility-administered medications for this visit.    Allergies   Allergies as of 10/19/2023   (No Known Allergies)        Review of Systems   General: Negative for anorexia, weight loss, fever, chills, fatigue, weakness. ENT: Negative for hoarseness, difficulty swallowing , nasal congestion. CV: Negative for chest pain, angina, palpitations, dyspnea on exertion, peripheral edema.  Respiratory: Negative for dyspnea at rest, dyspnea on exertion, cough, sputum, wheezing.  GI: See history of present illness. GU:  Negative for dysuria, hematuria, urinary incontinence, urinary frequency, nocturnal urination.  Endo: Negative for unusual weight change.  Physical Exam   BP 130/84   Pulse 91   Temp 98.5 F (36.9 C)   Ht 4' 11 (1.499 m)   Wt 140 lb 12.8 oz (63.9 kg)   LMP 09/27/2023   BMI 28.44 kg/m    General: Well-nourished, well-developed in no acute distress.  Eyes: No icterus. Mouth: Oropharyngeal mucosa moist and pink   Abdomen: Bowel sounds are normal, nontender, nondistended, no hepatosplenomegaly or masses,  no  abdominal bruits or hernia , no rebound or guarding.  Rectal: not performed Extremities: No lower extremity edema. No clubbing or deformities. Neuro: Alert and oriented x 4   Skin: Warm and dry, no jaundice.   Psych: Alert and cooperative, normal mood and affect.  Labs   Lab Results  Component Value Date   ALT 15 10/21/2023   AST 18 10/21/2023   ALKPHOS 57 10/21/2023   BILITOT 0.6 10/21/2023   Lab Results  Component Value Date   NA 140 06/08/2023   CL 103 06/08/2023   K 3.9 06/08/2023   CO2 20 06/08/2023   BUN 12 06/08/2023   CREATININE 0.74 06/08/2023   EGFR 99 06/08/2023   CALCIUM 9.4 06/08/2023   ALBUMIN 4.3 10/21/2023   GLUCOSE 76 06/08/2023   Lab Results  Component Value Date   WBC 9.3 06/08/2023   HGB 15.7 06/08/2023   HCT 47.7 (H) 06/08/2023   MCV 95 06/08/2023   PLT 350 06/08/2023    Imaging Studies   No results found.  Assessment/Plan:     Crohn's disease of the ileum Crohn's disease in the ileum with ongoing symptoms in the setting of delayed induction phase of Tremfya . She has good days 75% of the time but on bad days she has frequent loose stools and significant urgency and abdominal pain. 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  - Continue Tremfya  induction therapy as scheduled, transition into maintenance dose as prescribed. - Monitor symptoms and consider inflammatory markers if no improvement after induction phase.  -many patients can see clinical remission between weeks 12-24 -patient to reach out with progress report in six weeks   Sonny RAMAN. Ezzard, MHS, PA-C [facility]

## 2023-10-22 ENCOUNTER — Ambulatory Visit: Payer: Self-pay | Admitting: Gastroenterology

## 2023-10-22 DIAGNOSIS — K5000 Crohn's disease of small intestine without complications: Secondary | ICD-10-CM

## 2023-10-22 LAB — HEPATIC FUNCTION PANEL
ALT: 15 IU/L (ref 0–32)
AST: 18 IU/L (ref 0–40)
Albumin: 4.3 g/dL (ref 3.9–4.9)
Alkaline Phosphatase: 57 IU/L (ref 41–116)
Bilirubin Total: 0.6 mg/dL (ref 0.0–1.2)
Bilirubin, Direct: 0.15 mg/dL (ref 0.00–0.40)
Total Protein: 7.4 g/dL (ref 6.0–8.5)

## 2023-11-09 ENCOUNTER — Other Ambulatory Visit: Payer: Self-pay

## 2023-11-09 DIAGNOSIS — K5000 Crohn's disease of small intestine without complications: Secondary | ICD-10-CM

## 2023-11-10 ENCOUNTER — Telehealth: Payer: Self-pay

## 2023-11-10 NOTE — Telephone Encounter (Signed)
 Received a fax from [facility] Patient Support for the pt's Tremfya  and after Jan 20 2024 the pt will no longer be eligible for pt assistance through their program due to not meeting financial eligibility criteria. Letter is scanned under media.

## 2023-11-10 NOTE — Telephone Encounter (Signed)
 Please reach out to [facility] the rep and find out what we are supposed to do. I was advised previously by her that patients would receive medications for up to 3 years while [employer] worked to get [place] approved by insurance etc.   Patient's letter dated 08/23/23 said she would receive medication for up to one year as long as they are uninsured, or have inadequate coverage through commercial, employer group, or [employer], and are not supported by other [employer].  What changed in that statement.  Does she not have insurance anymore?

## 2023-11-10 NOTE — Telephone Encounter (Signed)
 Spoke to [place] with [employer] and he stated that the pt will need to send in a copy of her 1040 and hopefully she will qualify financially. If she doesn't unfortunately she will not be able to receive medication through the pt support program. Will reach out to pt to get a copy to fax into [place] support for review.

## 2023-11-11 NOTE — Telephone Encounter (Signed)
 Faxed financials to [employer] for processing.

## 2023-11-17 NOTE — Telephone Encounter (Signed)
 Spoke with [place] with [employer] and he said patient is on track for patient assistance the beginning of the new year.

## 2023-11-24 LAB — HEPATIC FUNCTION PANEL
ALT: 18 IU/L (ref 0–32)
AST: 16 IU/L (ref 0–40)
Albumin: 4.2 g/dL (ref 3.9–4.9)
Alkaline Phosphatase: 55 IU/L (ref 41–116)
Bilirubin Total: 0.6 mg/dL (ref 0.0–1.2)
Bilirubin, Direct: 0.15 mg/dL (ref 0.00–0.40)
Total Protein: 7.2 g/dL (ref 6.0–8.5)

## 2023-11-25 ENCOUNTER — Ambulatory Visit: Payer: Self-pay | Admitting: Gastroenterology

## 2023-11-25 DIAGNOSIS — K5000 Crohn's disease of small intestine without complications: Secondary | ICD-10-CM

## 2023-12-08 ENCOUNTER — Other Ambulatory Visit: Payer: Self-pay | Admitting: Gastroenterology

## 2023-12-14 ENCOUNTER — Encounter: Payer: Self-pay | Admitting: Internal Medicine

## 2024-02-07 ENCOUNTER — Other Ambulatory Visit: Payer: Self-pay

## 2024-02-07 DIAGNOSIS — K5000 Crohn's disease of small intestine without complications: Secondary | ICD-10-CM
